# Patient Record
Sex: Male | Born: 1960 | Race: White | Hispanic: No | Marital: Married | Smoking: Never smoker
Health system: Southern US, Community
[De-identification: ages and names within clinical notes are randomized; demographics above are authoritative.]

## PROBLEM LIST (undated history)

## (undated) DIAGNOSIS — G709 Myoneural disorder, unspecified: Secondary | ICD-10-CM

## (undated) DIAGNOSIS — M7122 Synovial cyst of popliteal space [Baker], left knee: Secondary | ICD-10-CM

## (undated) DIAGNOSIS — F419 Anxiety disorder, unspecified: Secondary | ICD-10-CM

## (undated) DIAGNOSIS — Z973 Presence of spectacles and contact lenses: Secondary | ICD-10-CM

## (undated) DIAGNOSIS — G4730 Sleep apnea, unspecified: Secondary | ICD-10-CM

## (undated) DIAGNOSIS — I10 Essential (primary) hypertension: Secondary | ICD-10-CM

## (undated) DIAGNOSIS — M25562 Pain in left knee: Secondary | ICD-10-CM

## (undated) DIAGNOSIS — R29898 Other symptoms and signs involving the musculoskeletal system: Secondary | ICD-10-CM

## (undated) DIAGNOSIS — M48061 Spinal stenosis, lumbar region without neurogenic claudication: Secondary | ICD-10-CM

## (undated) DIAGNOSIS — M1990 Unspecified osteoarthritis, unspecified site: Secondary | ICD-10-CM

## (undated) DIAGNOSIS — Z8669 Personal history of other diseases of the nervous system and sense organs: Secondary | ICD-10-CM

## (undated) DIAGNOSIS — J189 Pneumonia, unspecified organism: Secondary | ICD-10-CM

## (undated) DIAGNOSIS — G8929 Other chronic pain: Secondary | ICD-10-CM

## (undated) HISTORY — PX: EYE SURGERY: SHX253

## (undated) HISTORY — PX: OTHER SURGICAL HISTORY: SHX169

## (undated) HISTORY — PX: POSTERIOR LUMBAR FUSION: SHX6036

## (undated) HISTORY — PX: KNEE ARTHROSCOPY: SUR90

## (undated) HISTORY — DX: Essential (primary) hypertension: I10

---

## 1995-01-27 HISTORY — PX: CERVICAL SPINE SURGERY: SHX589

## 2006-01-26 HISTORY — PX: LUMBAR FUSION: SHX111

## 2014-06-01 DIAGNOSIS — M25519 Pain in unspecified shoulder: Secondary | ICD-10-CM | POA: Insufficient documentation

## 2014-06-01 DIAGNOSIS — M542 Cervicalgia: Secondary | ICD-10-CM | POA: Insufficient documentation

## 2014-07-02 ENCOUNTER — Other Ambulatory Visit: Payer: Self-pay | Admitting: Orthopedic Surgery

## 2014-07-02 DIAGNOSIS — M25512 Pain in left shoulder: Secondary | ICD-10-CM

## 2014-07-14 ENCOUNTER — Inpatient Hospital Stay: Admission: RE | Admit: 2014-07-14 | Payer: Self-pay | Source: Ambulatory Visit

## 2014-08-02 HISTORY — PX: SHOULDER ARTHROSCOPY W/ SUBACROMIAL DECOMPRESSION AND DISTAL CLAVICLE EXCISION: SHX2401

## 2014-08-08 ENCOUNTER — Encounter: Payer: Self-pay | Admitting: Physical Therapy

## 2014-08-08 ENCOUNTER — Ambulatory Visit: Payer: 59 | Attending: Orthopedic Surgery | Admitting: Physical Therapy

## 2014-08-08 DIAGNOSIS — M25612 Stiffness of left shoulder, not elsewhere classified: Secondary | ICD-10-CM | POA: Insufficient documentation

## 2014-08-08 DIAGNOSIS — R29898 Other symptoms and signs involving the musculoskeletal system: Secondary | ICD-10-CM

## 2014-08-08 DIAGNOSIS — M25512 Pain in left shoulder: Secondary | ICD-10-CM | POA: Insufficient documentation

## 2014-08-08 NOTE — Therapy (Signed)
[facility] Outpatient Rehabilitation [facility] [address]  [address] [place], [place], [address] Phone: [phone]   Fax:  [phone]  Physical Therapy Evaluation  Patient Details  Name: Hunter Swanson MRN: [id number] Date of Birth: [birth year] Referring Provider:  Dannielle Huh, MD  Encounter Date: 08/08/2014      PT End of Session - 08/08/14 1145    Visit Number 1   Number of Visits 12   Date for PT Re-Evaluation 09/19/14   PT Start Time 1103   PT Stop Time 1159   PT Time Calculation (min) 56 min      Past Medical History  Diagnosis Date  . Hypertension     Past Surgical History  Procedure Laterality Date  . Knee arthroscopy Left     5 surgeries most recent 2007  . Lumbar fusion  2008    L3-4  . Cervical spine surgery  1997    bone spur removal due to L UE radicular symptoms  . Shoulder arthroscopy w/ subacromial decompression and distal clavicle excision Left 08/02/14    There were no vitals filed for this visit.  Visit Diagnosis:  Left shoulder pain - Plan: PT plan of care cert/re-cert  Shoulder weakness - Plan: PT plan of care cert/re-cert  Shoulder stiffness, left - Plan: PT plan of care cert/re-cert      Subjective Assessment - 08/08/14 1053    Subjective Pt is s/p L Shoulder SAC and DCR performed on 08/02/14 performed by Dr. Sherlean Foot.  Pt states arm is moving better over the past few days.  No difficulty sleeping due to shoulder pain.  States had recently moved and was hanging ceiling fans at time of shoulder pain onset.   Currently in Pain? Yes   Pain Score 1   states L shoulder pain has ranged 1-2/10 over the past few days.   Pain Location Shoulder   Pain Orientation Left   Pain Frequency Intermittent   Aggravating Factors  activity   Pain Relieving Factors rest, meds, ice            [facility] PT Assessment - 08/08/14 0001    Assessment   Medical Diagnosis s/p L Shoulder DCR, SAD   Onset Date/Surgical Date 08/02/14   Next MD  Visit 09/06/14   Balance Screen   Has the patient fallen in the past 6 months No   Has the patient had a decrease in activity level because of a fear of falling?  No   Is the patient reluctant to leave their home because of a fear of falling?  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   Leisure denies regular exercise, has gym at work   Observation/Other Assessments   Focus on Therapeutic Outcomes (FOTO)  55% limitation   ROM / Strength   AROM / PROM / Strength AROM;PROM;Strength   AROM   AROM Assessment Site Shoulder   Right/Left Shoulder Left   Left Shoulder Extension --  WNL   Left Shoulder Flexion 70 Degrees   Left Shoulder ABduction 85 Degrees   Left Shoulder Internal Rotation --  IR Reach T10 bilaterally   Left Shoulder External Rotation --  ER Reach to base of skull (R to T3)   PROM   PROM Assessment Site Shoulder   Right/Left Shoulder Left   Left Shoulder Flexion 145 Degrees   Left Shoulder ABduction 134 Degrees   Left Shoulder Internal Rotation  72 Degrees   Left Shoulder External Rotation 80 Degrees   Strength   Strength Assessment Site Shoulder   Right/Left Shoulder Left   Left Shoulder Flexion 3-/5   Left Shoulder ABduction 3-/5   Left Shoulder Internal Rotation 4/5   Left Shoulder External Rotation 4/5            TODAY'S TREATMENT Manual - L Shoulder distraction along with grade 2 to 3 caudal and AP glides, mobes into Flexion and ABD to tolerance TherEx - HEP instruct and perform: supine wand Flexion AAROM 10x, Standing hand on wall shoulder flexion slide 10x Standing ER Yellow TB 20x  Vasopneumatic compression L shoulder, low pressure, 38 dg, 15'               PT Education - 08/08/14 1144    Education provided Yes   Education Details initial HEP   Person(s) Educated Patient   Methods Explanation;Demonstration;Handout   Comprehension Verbalized  understanding;Returned demonstration          PT Short Term Goals - 08/08/14 1145    PT SHORT TERM GOAL #1   Title pt independent with initial HEP by 08/17/14   Status New   PT SHORT TERM GOAL #2   Title L Shoulder AROM Flexion and ABD to 120 or better by 08/24/14   Status New           PT Long Term Goals - 08/08/14 1146    PT LONG TERM GOAL #1   Title pt independent with advanced HEP as necessary for continued progress by 09/19/14   Status New   PT LONG TERM GOAL #2   Title L shoulder AROM WFL all planes and MMT 4/5 or better all planes by 09/19/14   Status New   PT LONG TERM GOAL #3   Title pt able to return to full participation in all ADLs, chores, and recreational activities without limitation by shoulder pain, LOM, or weakness by 09/19/14   Status New               Plan - 08/08/14 1151    Clinical Impression Statement pt s/p L shoulder DCR and SAD on 08/02/14.  His pain is well controlled and has only been 1-2/10 lately.  His PROM is near normal into ER and IR (80 and 72 respectively), Flexion and ABD more restricted due to pain.  L Shoulder AROM quite restricted in Flexion and ABD with abnormal scapulohumeral rhythm noted (excessive and premature scapular elevation) and both limited to less than 90 degrees.  Pt instructed in HEP to address these impairements and will progress HEP as AROM and function improve.  Will address pt's poor posture (rounded shoulders) and body mechanics as these likely contributed to injury.  Anticipate pt meeting all goals in 4-6 weeks.   Pt will benefit from skilled therapeutic intervention in order to improve on the following deficits Pain;Postural dysfunction;Decreased strength;Decreased mobility;Improper body mechanics;Impaired UE functional use;Decreased range of motion   Rehab Potential Good   PT Frequency 2x / week   PT Duration 6 weeks   PT Treatment/Interventions Therapeutic exercise;Therapeutic activities;Functional mobility  training;Patient/family education;Taping;Vasopneumatic Device;Manual techniques;Neuromuscular re-education;Electrical Stimulation;Cryotherapy   PT Next Visit Plan manual for pain and ROM, exercise for AAROM and AROM to tolerance along with RC strengthening to tolerance   Consulted and Agree with Plan of Care Patient         Problem List There are no active problems to display for this patient.   Kymora Sciara PT,  OCS 08/08/2014, 12:01 PM  [facility] [address]  [address] [place], [place], [address] Phone: [phone]   Fax:  [phone]

## 2014-08-15 ENCOUNTER — Ambulatory Visit: Payer: 59 | Admitting: Rehabilitation

## 2014-08-15 DIAGNOSIS — R29898 Other symptoms and signs involving the musculoskeletal system: Secondary | ICD-10-CM

## 2014-08-15 DIAGNOSIS — M25612 Stiffness of left shoulder, not elsewhere classified: Secondary | ICD-10-CM

## 2014-08-15 DIAGNOSIS — M25512 Pain in left shoulder: Secondary | ICD-10-CM | POA: Diagnosis not present

## 2014-08-15 NOTE — Therapy (Signed)
[facility] Outpatient Rehabilitation [facility] [address]  [address] [place], [place], [address] Phone: [phone]   Fax:  [phone]  Physical Therapy Treatment  Patient Details  Name: Hunter Swanson MRN: [id number] Date of Birth: [birth year] Referring Provider:  Dannielle Huh, MD  Encounter Date: 08/15/2014      PT End of Session - 08/15/14 1104    Visit Number 2   Number of Visits 12   Date for PT Re-Evaluation 09/19/14   PT Start Time 1104   PT Stop Time 1157   PT Time Calculation (min) 53 min      Past Medical History  Diagnosis Date  . Hypertension     Past Surgical History  Procedure Laterality Date  . Knee arthroscopy Left     5 surgeries most recent 2007  . Lumbar fusion  2008    L3-4  . Cervical spine surgery  1997    bone spur removal due to L UE radicular symptoms  . Shoulder arthroscopy w/ subacromial decompression and distal clavicle excision Left 08/02/14    There were no vitals filed for this visit.  Visit Diagnosis:  Left shoulder pain  Shoulder weakness  Shoulder stiffness, left      Subjective Assessment - 08/15/14 1126    Subjective Reports his shoulder hasn't been hurting at all. Notices some discomfort with abduction movements.    Currently in Pain? No/denies            [facility] PT Assessment - 08/15/14 1107    AROM   AROM Assessment Site Shoulder   Right/Left Shoulder Left   Left Shoulder Flexion 150 Degrees   Left Shoulder ABduction 135 Degrees      TODAY'S TREATMENT TherEx - Seated Pulleys Flexion x2'  Manual - L Shoulder distraction along with grade 2 to 3 caudal and AP glides, PROM into Flexion and Abduction  Supine IR/ER iso at 45 degrees Abduction 5"x10 Supine Rhythmic Stab at 90 degrees Flexion 2x30" (all directions) Supine Pullover 4# x10 Supine Circles at 90 degrees Flexion 4# x15 CW/CCW Supine Serratus Punches 4# x15 Rt Side-Lying Lt ER 0# x10, 3# x10 Rt Side-Lying Lt Abduction 0#  x10 Pball (65cm) Roll up wall with lift off 3"x10 Standing Rows Blue TB x10 Standing Bil Extension Blue TB x10  Standing IR/ER with Red TB x15 each    Vasopneumatic compression L shoulder, low pressure, 38 dg, 15'   Increased HEP      PT Short Term Goals - 08/15/14 1122    PT SHORT TERM GOAL #1   Title pt independent with initial HEP by 08/17/14   Status Achieved   PT SHORT TERM GOAL #2   Title L Shoulder AROM Flexion and ABD to 120 or better by 08/24/14   Status Achieved           PT Long Term Goals - 08/15/14 1122    PT LONG TERM GOAL #1   Title pt independent with advanced HEP as necessary for continued progress by 09/19/14   Status On-going   PT LONG TERM GOAL #2   Title L shoulder AROM WFL all planes and MMT 4/5 or better all planes by 09/19/14   Status On-going   PT LONG TERM GOAL #3   Title pt able to return to full participation in all ADLs, chores, and recreational activities without limitation by shoulder pain, LOM, or weakness by 09/19/14   Status On-going  Plan - 08/15/14 1143    Clinical Impression Statement Excellent tolerance to exercises and improvements in ROM. Still noted shoulder hike with shoulder abduction and flexion, most prominent with abduction. Noted slight pain with PROM IR which pt reported was in the joint.      PT Next Visit Plan manual for pain and ROM, exercise for AAROM and AROM to tolerance along with RC strengthening to tolerance   Consulted and Agree with Plan of Care Patient        Problem List There are no active problems to display for this patient.   [address] Roberts Roadhelby J Tommy Goostree, PTA 08/15/2014, 11:51 AM  [facility] [place] [facility] [address]  [address] [place], [place], [address] Phone: [phone]   Fax:  [phone]

## 2014-08-16 ENCOUNTER — Ambulatory Visit: Payer: 59 | Admitting: Rehabilitation

## 2014-08-16 DIAGNOSIS — M25512 Pain in left shoulder: Secondary | ICD-10-CM

## 2014-08-16 DIAGNOSIS — R29898 Other symptoms and signs involving the musculoskeletal system: Secondary | ICD-10-CM

## 2014-08-16 DIAGNOSIS — M25612 Stiffness of left shoulder, not elsewhere classified: Secondary | ICD-10-CM

## 2014-08-16 NOTE — Therapy (Signed)
[facility] Outpatient Rehabilitation [facility] [address]  [address] [place], [place], [address] Phone: [phone]   Fax:  [phone]  Physical Therapy Treatment  Patient Details  Name: Hunter Swanson MRN: [id number] Date of Birth: November 05, 1960 Referring Provider:  Dannielle Huh, MD  Encounter Date: 08/16/2014      PT End of Session - 08/16/14 1316    Visit Number 3   Number of Visits 12   Date for PT Re-Evaluation 09/19/14   PT Start Time 1315   PT Stop Time 1355   PT Time Calculation (min) 40 min      Past Medical History  Diagnosis Date  . Hypertension     Past Surgical History  Procedure Laterality Date  . Knee arthroscopy Left     5 surgeries most recent 2007  . Lumbar fusion  2008    L3-4  . Cervical spine surgery  1997    bone spur removal due to L UE radicular symptoms  . Shoulder arthroscopy w/ subacromial decompression and distal clavicle excision Left 08/02/14    There were no vitals filed for this visit.  Visit Diagnosis:  Left shoulder pain  Shoulder weakness  Shoulder stiffness, left      Subjective Assessment - 08/16/14 1315    Subjective States shoulder is sore today (was just in here yesterday) but doing ok. Noted a sharp pain yesterday evening (2/10) that lasted only a few seconds.    Currently in Pain? No/denies      TODAY'S TREATMENT TherEx - UBE level 2.0 2'/2'  Manual - L Shoulder distraction along with grade 2 to 3 caudal and AP glides PROM into Flexion, Abduction, IR, ER.  STM to Lt Pec, lateral shoulder Rt Side-Lying Lt scap mobs grade 2.   TherEx - Rt Side-Lying Lt Abduction 0# x10 Rt Side-Lying Lt Horizontal Abduction 0# x10 Rt Side-Lying Lt Flexion 0# x10 Pball roll up wall (65cm) Roll up for Flexion Stretch 3"x10 Small Green Ball on wall circles at 90 degrees Flexion x15 cw/ccw then at 90 degrees abduction Corner Pec Stretch 3x20" Body Blade Bil Flexion 2x30", at side ER/IR 2x30", at side abd/add  2x30"        PT Short Term Goals - 08/15/14 1122    PT SHORT TERM GOAL #1   Title pt independent with initial HEP by 08/17/14   Status Achieved   PT SHORT TERM GOAL #2   Title L Shoulder AROM Flexion and ABD to 120 or better by 08/24/14   Status Achieved           PT Long Term Goals - 08/15/14 1122    PT LONG TERM GOAL #1   Title pt independent with advanced HEP as necessary for continued progress by 09/19/14   Status On-going   PT LONG TERM GOAL #2   Title L shoulder AROM WFL all planes and MMT 4/5 or better all planes by 09/19/14   Status On-going   PT LONG TERM GOAL #3   Title pt able to return to full participation in all ADLs, chores, and recreational activities without limitation by shoulder pain, LOM, or weakness by 09/19/14   Status On-going               Plan - 08/16/14 1318    Clinical Impression Statement Performed less intensity exercises today due to increased soreness from yesterday. No pain noted with exercises, just stretching per pt. Will try 1x week next week due to pt's  excellent status.    PT Next Visit Plan manual for pain and ROM, exercise for AAROM and AROM to tolerance along with RC strengthening to tolerance   Consulted and Agree with Plan of Care Patient        Problem List There are no active problems to display for this patient.   [name] [place], [place] 08/16/2014, 3:36 PM  [facility] [address]  [address] [place], [place], [address] Phone: [phone]   Fax:  [phone]

## 2014-08-21 ENCOUNTER — Ambulatory Visit: Payer: 59 | Admitting: Physical Therapy

## 2014-08-24 ENCOUNTER — Ambulatory Visit: Payer: 59 | Admitting: Rehabilitation

## 2014-08-24 DIAGNOSIS — R29898 Other symptoms and signs involving the musculoskeletal system: Secondary | ICD-10-CM

## 2014-08-24 DIAGNOSIS — M25512 Pain in left shoulder: Secondary | ICD-10-CM | POA: Diagnosis not present

## 2014-08-24 DIAGNOSIS — M25612 Stiffness of left shoulder, not elsewhere classified: Secondary | ICD-10-CM

## 2014-08-24 NOTE — Therapy (Addendum)
[facility] [place] [address]  [place], [place], [address] Phone: [phone]   Fax:  [phone]  Physical Therapy Treatment  Patient Details  Name: Hunter Swanson MRN: [id number] Date of Birth: 1960-02-06 Referring Provider:  Vickey Huger, MD  Encounter Date: 08/24/2014      PT End of Session - 08/24/14 1111    Visit Number 4   Number of Visits 12   Date for PT Re-Evaluation 09/19/14   PT Start Time 1105   PT Stop Time 1150   PT Time Calculation (min) 45 min      Past Medical History  Diagnosis Date  . Hypertension     Past Surgical History  Procedure Laterality Date  . Knee arthroscopy Left     5 surgeries Swanson recent 2007  . Lumbar fusion  2008    L3-4  . Cervical spine surgery  1997    bone spur removal due to L UE radicular symptoms  . Shoulder arthroscopy w/ subacromial decompression and distal clavicle excision Left 08/02/14    There were no vitals filed for this visit.  Visit Diagnosis:  Left shoulder pain  Shoulder weakness  Shoulder stiffness, left      Subjective Assessment - 08/24/14 1106    Subjective Reports shoulder is going great. Went out and hit some golf balls last Sunday without pain. Has not had pain over the past week, just some soreness from working it out.    Currently in Pain? No/denies            [facility] PT Assessment - 08/24/14 1107    ROM / Strength   AROM / PROM / Strength AROM;PROM;Strength   AROM   AROM Assessment Site Shoulder   Right/Left Shoulder Left   Left Shoulder Flexion 154 Degrees   Left Shoulder ABduction 154 Degrees   Left Shoulder Internal Rotation 48 Degrees  FIR reach to T10 bilaterally   Left Shoulder External Rotation 70 Degrees  FER Reach to T3 bilaterally   PROM   PROM Assessment Site Shoulder   Right/Left Shoulder Left   Left Shoulder Flexion 160 Degrees   Left Shoulder ABduction 158 Degrees   Left Shoulder Internal Rotation 80 Degrees    Left Shoulder External Rotation 90 Degrees   Strength   Strength Assessment Site Shoulder   Right/Left Shoulder Left   Left Shoulder Flexion 4/5  slight pain all around shoulder   Left Shoulder ABduction 4/5  slight pain all around shoulder   Left Shoulder Internal Rotation 4+/5   Left Shoulder External Rotation 4+/5      TODAY'S TREATMENT TherEx - ROM/MMT Rt Side-Lying Lt Abduction 2# x10 Rt Side-Lying Lt ER 2# x10 Rt Side-Lying Lt Flexion 2# x10 Standing Pball (55cm) roll up wall x10, then with lift off 3"x10 Standing with 1/2 Foam Roll along spine: Bilateral Flexion 2# x10, scaption 2# x10, Bilateral Shoulder ER Green TB x15, Bilateral Shoulder Horizontal Abduction Green TB x15 Prone over pbal (65cm) Y 2# x10, Bent T 2# x10 Standing Lt Flexion with Green TB x10 Standing Lt Abduction with Green TB x10  Corner Pec Stretch 3x20" Low Row 35# 3x10  Increased HEP      PT Short Term Goals - 08/15/14 1122    PT SHORT TERM GOAL #1   Title pt independent with initial HEP by 08/17/14   Status Achieved   PT SHORT TERM GOAL #2   Title L Shoulder AROM Flexion and ABD to  120 or better by 08/24/14   Status Achieved           PT Long Term Goals - 08/15/14 1122    PT LONG TERM GOAL #1   Title pt independent with advanced HEP as necessary for continued progress by 09/19/14   Status Achieved   PT LONG TERM GOAL #2   Title L shoulder AROM WFL all planes and MMT 4/5 or better all planes by 09/19/14   Status Achieved but with slight pain   PT LONG TERM GOAL #3   Title pt able to return to full participation in all ADLs, chores, and recreational activities without limitation by shoulder pain, LOM, or weakness by 09/19/14   Status On-going               Plan - 08/24/14 1154    Clinical Impression Statement Pt continues to demonstrate excellent progress with ROM and MMTing. He was able to go hit some golf balls without pain. He would like to continue just 1 time a week and  perfom his HEP. AROM is near normal compared to the non surgical side. Flexion and abduction MMTing are still at 4/5 while IR and ER are 4+/5.    PT Next Visit Plan Continue with strengthening and return to golfing exercises.    Consulted and Agree with Plan of Care Patient        Problem List There are no active problems to display for this patient.   [place], PTA 08/24/2014, 11:57 AM  [facility] [address]  [place], [place], [address] Phone: [phone]   Fax:  [phone]     PHYSICAL THERAPY DISCHARGE SUMMARY  Visits from Start of Care: 4  Current functional level related to goals / functional outcomes Goals likely met   Remaining deficits: Intermittent slight pain, mild shoulder weakness   Education / Equipment: HEP  Plan: Patient agrees to discharge.  Patient goals were partially met. Patient is being discharged due to not returning since the last visit.  ?????       Hunter Swanson last seen on 08/24/14 at which time he was progressing very well and his PT frequency was decreased to 1x/wk; however, Hunter Swanson never returned for additional appointments.  He's likely doing fine given excellent progress at last appointment.  Since we haven't seen Hunter Swanson for over 30 days we are discharging him from our care at this time.  Hunter Swanson PT, OCS 09/28/2014  10:30 AM

## 2014-08-28 ENCOUNTER — Ambulatory Visit: Payer: 59 | Admitting: Physical Therapy

## 2014-08-30 ENCOUNTER — Ambulatory Visit: Payer: 59 | Attending: Orthopedic Surgery | Admitting: Rehabilitation

## 2014-09-04 ENCOUNTER — Ambulatory Visit: Payer: 59 | Admitting: Physical Therapy

## 2014-09-07 ENCOUNTER — Ambulatory Visit: Payer: 59 | Admitting: Physical Therapy

## 2014-09-10 ENCOUNTER — Encounter: Payer: 59 | Admitting: Rehabilitation

## 2014-09-11 ENCOUNTER — Ambulatory Visit: Payer: 59 | Admitting: Rehabilitation

## 2014-09-14 ENCOUNTER — Ambulatory Visit: Payer: 59 | Admitting: Physical Therapy

## 2014-09-18 ENCOUNTER — Ambulatory Visit: Payer: 59 | Admitting: Rehabilitation

## 2014-09-21 ENCOUNTER — Ambulatory Visit: Payer: 59 | Admitting: Physical Therapy

## 2016-03-18 HISTORY — PX: VITRECTOMY: SHX106

## 2016-11-10 DIAGNOSIS — G8929 Other chronic pain: Secondary | ICD-10-CM | POA: Insufficient documentation

## 2016-12-24 ENCOUNTER — Other Ambulatory Visit: Payer: Self-pay | Admitting: Orthopedic Surgery

## 2016-12-24 DIAGNOSIS — M25562 Pain in left knee: Principal | ICD-10-CM

## 2016-12-24 DIAGNOSIS — G8929 Other chronic pain: Secondary | ICD-10-CM

## 2016-12-30 ENCOUNTER — Ambulatory Visit
Admission: RE | Admit: 2016-12-30 | Discharge: 2016-12-30 | Disposition: A | Discharge status: Home / Self Care | Payer: 59 | Source: Ambulatory Visit | Attending: Orthopedic Surgery | Admitting: Orthopedic Surgery

## 2016-12-30 DIAGNOSIS — G8929 Other chronic pain: Secondary | ICD-10-CM

## 2016-12-30 DIAGNOSIS — M25562 Pain in left knee: Principal | ICD-10-CM

## 2016-12-31 DIAGNOSIS — S83282A Other tear of lateral meniscus, current injury, left knee, initial encounter: Secondary | ICD-10-CM | POA: Insufficient documentation

## 2017-01-21 DIAGNOSIS — Z9889 Other specified postprocedural states: Secondary | ICD-10-CM | POA: Insufficient documentation

## 2017-02-22 ENCOUNTER — Ambulatory Visit
Admission: RE | Admit: 2017-02-22 | Discharge: 2017-02-22 | Disposition: A | Discharge status: Home / Self Care | Payer: 59 | Source: Ambulatory Visit | Attending: Orthopedic Surgery | Admitting: Orthopedic Surgery

## 2017-02-22 ENCOUNTER — Other Ambulatory Visit: Payer: Self-pay | Admitting: Orthopedic Surgery

## 2017-02-22 DIAGNOSIS — M25562 Pain in left knee: Secondary | ICD-10-CM

## 2017-02-24 DIAGNOSIS — M5416 Radiculopathy, lumbar region: Secondary | ICD-10-CM | POA: Insufficient documentation

## 2017-03-03 DIAGNOSIS — M48061 Spinal stenosis, lumbar region without neurogenic claudication: Secondary | ICD-10-CM | POA: Insufficient documentation

## 2017-03-04 ENCOUNTER — Other Ambulatory Visit: Payer: Self-pay | Admitting: Orthopedic Surgery

## 2017-03-04 DIAGNOSIS — M48061 Spinal stenosis, lumbar region without neurogenic claudication: Secondary | ICD-10-CM

## 2017-03-09 ENCOUNTER — Ambulatory Visit
Admission: RE | Admit: 2017-03-09 | Discharge: 2017-03-09 | Disposition: A | Discharge status: Home / Self Care | Payer: 59 | Source: Ambulatory Visit | Attending: Orthopedic Surgery | Admitting: Orthopedic Surgery

## 2017-03-09 DIAGNOSIS — M48061 Spinal stenosis, lumbar region without neurogenic claudication: Secondary | ICD-10-CM

## 2017-03-09 MED ORDER — GADOBENATE DIMEGLUMINE 529 MG/ML IV SOLN
20.0000 mL | Freq: Once | INTRAVENOUS | Status: AC | PRN
  Administered 2017-03-09: 20 mL via INTRAVENOUS

## 2017-06-30 DIAGNOSIS — Z981 Arthrodesis status: Secondary | ICD-10-CM | POA: Insufficient documentation

## 2017-06-30 DIAGNOSIS — M1612 Unilateral primary osteoarthritis, left hip: Secondary | ICD-10-CM | POA: Insufficient documentation

## 2017-07-02 DIAGNOSIS — M1712 Unilateral primary osteoarthritis, left knee: Secondary | ICD-10-CM | POA: Insufficient documentation

## 2017-07-13 NOTE — H&P (Signed)
TOTAL HIP ADMISSION H&P  Patient is admitted for left total hip arthroplasty.  Subjective:  Chief Complaint: left hip pain  HPI: Hunter Swanson, 57 y.o. male, has a history of pain and functional disability in the left hip(s) due to arthritis and patient has failed non-surgical conservative treatments for greater than 12 weeks to include NSAID's and/or analgesics, corticosteriod injections and activity modification.  Onset of symptoms was abrupt starting less than a year ago with rapidly worsening course since that time.The patient noted no past surgery on the left hip(s).  Patient currently rates pain in the left hip at 9 out of 10 with activity. Patient has night pain, worsening of pain with activity and weight bearing and pain that interfers with activities of daily living. Patient has evidence of joint space narrowing with near complete erosion of the femoral head by imaging studies. This condition presents safety issues increasing the risk of falls. There is no current active infection.  There are no active problems to display for this patient.  Past Medical History:  Diagnosis Date  . Hypertension     Past Surgical History:  Procedure Laterality Date  . CERVICAL SPINE SURGERY  1997   bone spur removal due to L UE radicular symptoms  . KNEE ARTHROSCOPY Left    5 surgeries most recent 2007  . LUMBAR FUSION  2008   L3-4  . SHOULDER ARTHROSCOPY W/ SUBACROMIAL DECOMPRESSION AND DISTAL CLAVICLE EXCISION Left 08/02/14    No current facility-administered medications for this encounter.    Current Outpatient Medications  Medication Sig Dispense Refill Last Dose  . aspirin 81 MG tablet Take 81 mg by mouth daily.   Taking  . diclofenac (VOLTAREN) 75 MG EC tablet Take 75 mg by mouth 2 (two) times daily.   Taking  . losartan (COZAAR) 100 MG tablet Take 100 mg by mouth daily.   Taking  . venlafaxine (EFFEXOR) 75 MG tablet Take 75 mg by mouth 2 (two) times daily.      Allergies  Allergen  Reactions  . Neurontin [Gabapentin] Rash  . Penicillins Rash    Social History   Tobacco Use  . Smoking status: Never Smoker  Substance Use Topics  . Alcohol use: Not on file    No family history on file.   Review of Systems  Constitutional: Negative for chills and fever.  HENT: Negative for congestion, sore throat and tinnitus.   Eyes: Negative for double vision, photophobia and pain.  Respiratory: Negative for cough, shortness of breath and wheezing.   Cardiovascular: Negative for chest pain, palpitations and orthopnea.  Gastrointestinal: Negative for heartburn, nausea and vomiting.  Genitourinary: Negative for dysuria, frequency and urgency.  Musculoskeletal: Positive for joint pain.  Neurological: Negative for dizziness, weakness and headaches.  Psychiatric/Behavioral: Negative for depression.    Objective: Well nourished and well developed. General: Alert and oriented x3, cooperative and pleasant, no acute distress. Head: normocephalic, atraumatic, neck supple. Eyes: EOMI. Respiratory: breath sounds clear in all fields, no wheezing, rales, or rhonchi. Cardiovascular: Regular rate and rhythm, no murmurs, gallops or rubs.  Abdomen: non-tender to palpation and soft, normoactive bowel sounds. Musculoskeletal: Left Hip Exam: ROM: Flexion to 90, Internal Rotation 0, External Rotation 0, and Abduction 20 degrees.  There is no tenderness over the greater trochanter.  Left Knee Exam:  No effusion.  Range of motion is 0-125 degrees.  Moderate crepitus on range of motion of the knee.  No medial or lateral joint line tenderness.  Stable knee. Calves  soft and nontender. Motor function intact in LE. Strength 5/5 LE bilaterally. Neuro: Distal pulses 2+. Sensation to light touch intact in LE  Vital signs in last 24 hours: BP: 138/92 mmHg Pulse: 88 bpm  Labs:  There is no height or weight on file to calculate BMI.  Imaging Review Plain radiographs demonstrate severe  degenerative joint disease of the left hip(s). The bone quality appears to be adequate for age and reported activity level.  Preoperative templating of the joint replacement has been completed, documented, and submitted to the Operating Room personnel in order to optimize intra-operative equipment management.  Assessment/Plan:  End stage arthritis, left hip(s)  The patient history, physical examination, clinical judgement of the provider and imaging studies are consistent with end stage degenerative joint disease of the left hip(s) and total hip arthroplasty is deemed medically necessary. The treatment options including medical management, injection therapy, arthroscopy and arthroplasty were discussed at length. The risks and benefits of total hip arthroplasty were presented and reviewed. The risks due to aseptic loosening, infection, stiffness, dislocation/subluxation,  thromboembolic complications and other imponderables were discussed.  The patient acknowledged the explanation, agreed to proceed with the plan and consent was signed. Patient is being admitted for inpatient treatment for surgery, pain control, PT, OT, prophylactic antibiotics, VTE prophylaxis, progressive ambulation and ADL's and discharge planning.The patient is planning to be discharged home with outpatient physical therapy at [facility].   Therapy Plans: outpatient therapy at [facility] Disposition: Home with family Planned DVT Prophylaxis: aspirin 325mg  BID DME needed: none PCP: Dr. Earlene Plater at [facility] in [place] TXA: IV Allergies: Penicillin (rash)  - Patient was instructed on what medications to stop prior to surgery. - Follow-up visit in 2 weeks with Dr. Lequita Halt - Begin physical therapy following surgery - Pre-operative lab work as pre-surgical testing - Prescriptions will be provided in hospital at time of discharge  Arther Abbott, PA-C Orthopedic Surgery [facility] [place]

## 2017-07-23 NOTE — Progress Notes (Signed)
07-15-17 Surgical Clearance on chart from Dr. Julius BowelsPollock

## 2017-07-23 NOTE — Patient Instructions (Addendum)
Your procedure is scheduled on:  Monday, August 02, 2017   [facility]    Report to admitting at  11:45 AM   Call this number if you have problems the morning of surgery [phone]   Do not eat food:After Midnight. From midnight until 8:00 am day of surgery    May have Clear Liquid diet.      CLEAR LIQUID DIET   Foods Allowed                                                                     Foods Excluded  Water, Coffee and tea, regular and decaf                             liquids that you cannot  Plain Jell-O in any flavor                                             see through such as: Fruit ices (not with fruit pulp)                                     milk, soups, orange juice  Iced Popsicles                                    All solid food Carbonated beverages, regular and diet                                    Cranberry, grape and apple juices Sports drinks like Gatorade Lightly seasoned clear broth or consume(fat free) Sugar, honey syrup  Sample Menu Breakfast                                Lunch                                     Supper Cranberry juice                    Beef broth                            Chicken broth Jell-O                                     Grape juice                           Apple juice Coffee or tea  Jell-O                                      Popsicle                                                Coffee or tea                        Coffee or tea   Take these medicines the morning of surgery with A SIP OF WATER:      None, May use Flonase if needed                               You may not have any metal on your body including jewelry, and body piercings             Do not wear lotions, powders, perfumes/cologne, or deodorant                          Men may shave face and neck.   Do not bring valuables to the hospital. [facility] IS NOT              RESPONSIBLE   FOR VALUABLES.   Contacts, dentures or bridgework may not be worn into surgery.   Leave suitcase in the car. After surgery it may be brought to your room.   Special Instructions:     Deep Breathing/ Cough and Leg Exercises              Please read over the following fact sheets you were given:  [facility] - Preparing for Surgery Before surgery, you can play an important role.  Because skin is not sterile, your skin needs to be as free of germs as possible.  You can reduce the number of germs on your skin by washing with CHG (chlorahexidine gluconate) soap before surgery.  CHG is an antiseptic cleaner which kills germs and bonds with the skin to continue killing germs even after washing. Please DO NOT use if you have an allergy to CHG or antibacterial soaps.  If your skin becomes reddened/irritated stop using the CHG and inform your nurse when you arrive at Short Stay. Do not shave (including legs and underarms) for at least 48 hours prior to the first CHG shower.  You may shave your face/neck.  Please follow these instructions carefully:  1.  Shower with CHG Soap the night before surgery and the  morning of surgery.  2.  If you choose to wash your hair, wash your hair first as usual with your normal  shampoo.  3.  After you shampoo, rinse your hair and body thoroughly to remove the shampoo.                             4.  Use CHG as you would any other liquid soap.  You can apply chg directly to the skin and wash.                    Gently with a scrungie or clean washcloth.  5.  Apply the CHG Soap to your body ONLY FROM THE NECK DOWN.                       Do not use on face/ open Wound or open sores.                      Avoid contact with eyes, ears mouth and genitals (private parts).                       Wash face,  Genitals (private parts) with your normal soap.             6.  Wash thoroughly, paying special attention to the area where your surgery  will be  performed.  7.  Thoroughly rinse your body with warm water from the neck down.  8.  DO NOT shower/wash with your normal soap after using and rinsing off the CHG Soap.             9.  Pat yourself dry with a clean towel.            10.  Wear clean pajamas.            11.  Place clean sheets on your bed the night of your first shower and do not  sleep with pets. Day of Surgery : Do not apply any lotions/deodorants the morning of surgery.  Please wear clean clothes to the hospital/surgery center.  FAILURE TO FOLLOW THESE INSTRUCTIONS MAY RESULT IN THE CANCELLATION OF YOUR SURGERY  PATIENT SIGNATURE_________________________________  NURSE SIGNATURE__________________________________  ________________________________________________________________________   Hunter Swanson  An incentive spirometer is a tool that can help keep your lungs clear and active. This tool measures how well you are filling your lungs with each breath. Taking long deep breaths may help reverse or decrease the chance of developing breathing (pulmonary) problems (especially infection) following:  A long period of time when you are unable to move or be active. BEFORE THE PROCEDURE   If the spirometer includes an indicator to show your best effort, your nurse or respiratory therapist will set it to a desired goal.  If possible, sit up straight or lean slightly forward. Try not to slouch.  Hold the incentive spirometer in an upright position. INSTRUCTIONS FOR USE  1. Sit on the edge of your bed if possible, or sit up as far as you can in bed or on a chair. 2. Hold the incentive spirometer in an upright position. 3. Breathe out normally. 4. Place the mouthpiece in your mouth and seal your lips tightly around it. 5. Breathe in slowly and as deeply as possible, raising the piston or the ball toward the top of the column. 6. Hold your breath for 3-5 seconds or for as long as possible. Allow the piston or ball to  fall to the bottom of the column. 7. Remove the mouthpiece from your mouth and breathe out normally. 8. Rest for a few seconds and repeat Steps 1 through 7 at least 10 times every 1-2 hours when you are awake. Take your time and take a few normal breaths between deep breaths. 9. The spirometer may include an indicator to show your best effort. Use the indicator as a goal to work toward during each repetition. 10. After each set of 10 deep breaths, practice coughing to be sure your lungs are clear. If you have an incision (the cut made at the  time of surgery), support your incision when coughing by placing a pillow or rolled up towels firmly against it. Once you are able to get out of bed, walk around indoors and cough well. You may stop using the incentive spirometer when instructed by your caregiver.  RISKS AND COMPLICATIONS  Take your time so you do not get dizzy or light-headed.  If you are in pain, you may need to take or ask for pain medication before doing incentive spirometry. It is harder to take a deep breath if you are having pain. AFTER USE  Rest and breathe slowly and easily.  It can be helpful to keep track of a log of your progress. Your caregiver can provide you with a simple table to help with this. If you are using the spirometer at home, follow these instructions: SEEK MEDICAL CARE IF:   You are having difficultly using the spirometer.  You have trouble using the spirometer as often as instructed.  Your pain medication is not giving enough relief while using the spirometer.  You develop fever of 100.5 F (38.1 C) or higher. SEEK IMMEDIATE MEDICAL CARE IF:   You cough up bloody sputum that had not been present before.  You develop fever of 102 F (38.9 C) or greater.  You develop worsening pain at or near the incision site. MAKE SURE YOU:   Understand these instructions.  Will watch your condition.  Will get help right away if you are not doing well or get  worse. Document Released: 05/25/2006 Document Revised: 04/06/2011 Document Reviewed: 07/26/2006 Exit[employer] Patient Information 2014 ExitCare, [place].   ________________________________________________________________________  WHAT IS A BLOOD TRANSFUSION? Blood Transfusion Information  A transfusion is the replacement of blood or some of its parts. Blood is made up of multiple cells which provide different functions.  Red blood cells carry oxygen and are used for blood loss replacement.  White blood cells fight against infection.  Platelets control bleeding.  Plasma helps clot blood.  Other blood products are available for specialized needs, such as hemophilia or other clotting disorders. BEFORE THE TRANSFUSION  Who gives blood for transfusions?   Healthy volunteers who are fully evaluated to make sure their blood is safe. This is blood bank blood. Transfusion therapy is the safest it has ever been in the practice of medicine. Before blood is taken from a donor, a complete history is taken to make sure that person has no history of diseases nor engages in risky social behavior (examples are intravenous drug use or sexual activity with multiple partners). The donor's travel history is screened to minimize risk of transmitting infections, such as malaria. The donated blood is tested for signs of infectious diseases, such as HIV and hepatitis. The blood is then tested to be sure it is compatible with you in order to minimize the chance of a transfusion reaction. If you or a relative donates blood, this is often done in anticipation of surgery and is not appropriate for emergency situations. It takes many days to process the donated blood. RISKS AND COMPLICATIONS Although transfusion therapy is very safe and saves many lives, the main dangers of transfusion include:   Getting an infectious disease.  Developing a transfusion reaction. This is an allergic reaction to something in the blood you  were given. Every precaution is taken to prevent this. The decision to have a blood transfusion has been considered carefully by your caregiver before blood is given. Blood is not given unless the benefits outweigh the risks.  AFTER THE TRANSFUSION  Right after receiving a blood transfusion, you will usually feel much better and more energetic. This is especially true if your red blood cells have gotten low (anemic). The transfusion raises the level of the red blood cells which carry oxygen, and this usually causes an energy increase.  The nurse administering the transfusion will monitor you carefully for complications. HOME CARE INSTRUCTIONS  No special instructions are needed after a transfusion. You may find your energy is better. Speak with your caregiver about any limitations on activity for underlying diseases you may have. SEEK MEDICAL CARE IF:   Your condition is not improving after your transfusion.  You develop redness or irritation at the intravenous (IV) site. SEEK IMMEDIATE MEDICAL CARE IF:  Any of the following symptoms occur over the next 12 hours:  Shaking chills.  You have a temperature by mouth above 102 F (38.9 C), not controlled by medicine.  Chest, back, or muscle pain.  People around you feel you are not acting correctly or are confused.  Shortness of breath or difficulty breathing.  Dizziness and fainting.  You get a rash or develop hives.  You have a decrease in urine output.  Your urine turns a dark color or changes to pink, red, or brown. Any of the following symptoms occur over the next 10 days:  You have a temperature by mouth above 102 F (38.9 C), not controlled by medicine.  Shortness of breath.  Weakness after normal activity.  The white part of the eye turns yellow (jaundice).  You have a decrease in the amount of urine or are urinating less often.  Your urine turns a dark color or changes to pink, red, or brown. Document Released:  01/10/2000 Document Revised: 04/06/2011 Document Reviewed: 08/29/2007 [facility] Patient Information 2014 [place], [place].  _______________________________________________________________________

## 2017-07-26 ENCOUNTER — Encounter (HOSPITAL_COMMUNITY): Payer: Self-pay

## 2017-07-27 ENCOUNTER — Encounter (HOSPITAL_COMMUNITY): Payer: Self-pay

## 2017-07-27 ENCOUNTER — Other Ambulatory Visit: Payer: Self-pay

## 2017-07-27 ENCOUNTER — Encounter (HOSPITAL_COMMUNITY)
Admission: RE | Admit: 2017-07-27 | Discharge: 2017-07-27 | Disposition: A | Discharge status: Home / Self Care | Payer: 59 | Source: Ambulatory Visit | Attending: Orthopedic Surgery | Admitting: Orthopedic Surgery

## 2017-07-27 DIAGNOSIS — Z01812 Encounter for preprocedural laboratory examination: Secondary | ICD-10-CM | POA: Insufficient documentation

## 2017-07-27 DIAGNOSIS — Z01810 Encounter for preprocedural cardiovascular examination: Secondary | ICD-10-CM | POA: Diagnosis present

## 2017-07-27 HISTORY — DX: Pain in left knee: M25.562

## 2017-07-27 HISTORY — DX: Personal history of other diseases of the nervous system and sense organs: Z86.69

## 2017-07-27 HISTORY — DX: Spinal stenosis, lumbar region without neurogenic claudication: M48.061

## 2017-07-27 HISTORY — DX: Presence of spectacles and contact lenses: Z97.3

## 2017-07-27 HISTORY — DX: Anxiety disorder, unspecified: F41.9

## 2017-07-27 HISTORY — DX: Other symptoms and signs involving the musculoskeletal system: R29.898

## 2017-07-27 HISTORY — DX: Unspecified osteoarthritis, unspecified site: M19.90

## 2017-07-27 HISTORY — DX: Synovial cyst of popliteal space (Baker), left knee: M71.22

## 2017-07-27 HISTORY — DX: Other chronic pain: G89.29

## 2017-07-27 LAB — COMPREHENSIVE METABOLIC PANEL
ALBUMIN: 4.1 g/dL (ref 3.5–5.0)
ALT: 59 U/L — ABNORMAL HIGH (ref 0–44)
AST: 45 U/L — AB (ref 15–41)
Alkaline Phosphatase: 103 U/L (ref 38–126)
Anion gap: 9 (ref 5–15)
BILIRUBIN TOTAL: 0.6 mg/dL (ref 0.3–1.2)
BUN: 17 mg/dL (ref 6–20)
CALCIUM: 9.6 mg/dL (ref 8.9–10.3)
CO2: 24 mmol/L (ref 22–32)
Chloride: 104 mmol/L (ref 98–111)
Creatinine, Ser: 1.03 mg/dL (ref 0.61–1.24)
GFR calc Af Amer: >60 mL/min (ref >60)
GLUCOSE: 100 mg/dL — AB (ref 70–99)
POTASSIUM: 4 mmol/L (ref 3.5–5.1)
Sodium: 137 mmol/L (ref 135–145)
TOTAL PROTEIN: 7.5 g/dL (ref 6.5–8.1)

## 2017-07-27 LAB — URINALYSIS, ROUTINE W REFLEX MICROSCOPIC
BILIRUBIN URINE: NEGATIVE
Glucose, UA: NEGATIVE mg/dL
Hgb urine dipstick: NEGATIVE
Ketones, ur: NEGATIVE mg/dL
Leukocytes, UA: NEGATIVE
NITRITE: NEGATIVE
PH: 6 (ref 5.0–8.0)
Protein, ur: NEGATIVE mg/dL
SPECIFIC GRAVITY, URINE: 1.018 (ref 1.005–1.030)

## 2017-07-27 LAB — CBC
HEMATOCRIT: 43.8 % (ref 39.0–52.0)
HEMOGLOBIN: 14.9 g/dL (ref 13.0–17.0)
MCH: 29.9 pg (ref 26.0–34.0)
MCHC: 34 g/dL (ref 30.0–36.0)
MCV: 88 fL (ref 78.0–100.0)
Platelets: 368 10*3/uL (ref 150–400)
RBC: 4.98 MIL/uL (ref 4.22–5.81)
RDW: 15.4 % (ref 11.5–15.5)
WBC: 6.1 10*3/uL (ref 4.0–10.5)

## 2017-07-27 LAB — PROTIME-INR
INR: 1.04
PROTHROMBIN TIME: 13.5 s (ref 11.4–15.2)

## 2017-07-27 LAB — SURGICAL PCR SCREEN
MRSA, PCR: NEGATIVE
STAPHYLOCOCCUS AUREUS: NEGATIVE

## 2017-07-27 LAB — ABO/RH: ABO/RH(D): O POS

## 2017-07-27 LAB — APTT: aPTT: 30 seconds (ref 24–36)

## 2017-07-27 NOTE — Progress Notes (Addendum)
Clearance note dated 07-09-2017 from dr Lequita Haltaluisio office via fax from dr Timothy Lassorusso in chart.   ADDENDUM:  CMET result dated 07-27-2017 sent to dr Lequita Haltaluisio in epic.  ADDENDUM:  Final EKG dated 07-27-2017 in epic.

## 2017-08-01 MED ORDER — TRANEXAMIC ACID 1000 MG/10ML IV SOLN
1000.0000 mg | INTRAVENOUS | Status: AC
  Administered 2017-08-02: 1000 mg via INTRAVENOUS
  Filled 2017-08-01: qty 1100

## 2017-08-01 MED ORDER — DEXTROSE 5 % IV SOLN
3.0000 g | INTRAVENOUS | Status: AC
  Administered 2017-08-02: 3 g via INTRAVENOUS
  Filled 2017-08-01: qty 3

## 2017-08-02 ENCOUNTER — Inpatient Hospital Stay (HOSPITAL_COMMUNITY): Payer: 59

## 2017-08-02 ENCOUNTER — Inpatient Hospital Stay (HOSPITAL_COMMUNITY): Payer: 59 | Admitting: Certified Registered"

## 2017-08-02 ENCOUNTER — Encounter (HOSPITAL_COMMUNITY)
Admission: RE | Disposition: A | Discharge status: Home / Self Care | Payer: Self-pay | Source: Ambulatory Visit | Attending: Orthopedic Surgery

## 2017-08-02 ENCOUNTER — Inpatient Hospital Stay (HOSPITAL_COMMUNITY)
Admission: RE | Admit: 2017-08-02 | Discharge: 2017-08-03 | DRG: 470 | Disposition: A | Discharge status: Home / Self Care | Payer: 59 | Source: Ambulatory Visit | Attending: Orthopedic Surgery | Admitting: Orthopedic Surgery

## 2017-08-02 ENCOUNTER — Other Ambulatory Visit: Payer: Self-pay

## 2017-08-02 ENCOUNTER — Encounter (HOSPITAL_COMMUNITY): Payer: Self-pay | Admitting: *Deleted

## 2017-08-02 DIAGNOSIS — Z7982 Long term (current) use of aspirin: Secondary | ICD-10-CM | POA: Diagnosis not present

## 2017-08-02 DIAGNOSIS — M48061 Spinal stenosis, lumbar region without neurogenic claudication: Secondary | ICD-10-CM | POA: Diagnosis present

## 2017-08-02 DIAGNOSIS — G4730 Sleep apnea, unspecified: Secondary | ICD-10-CM | POA: Diagnosis present

## 2017-08-02 DIAGNOSIS — M1612 Unilateral primary osteoarthritis, left hip: Principal | ICD-10-CM | POA: Diagnosis present

## 2017-08-02 DIAGNOSIS — Z981 Arthrodesis status: Secondary | ICD-10-CM

## 2017-08-02 DIAGNOSIS — Z880 Allergy status to penicillin: Secondary | ICD-10-CM

## 2017-08-02 DIAGNOSIS — M169 Osteoarthritis of hip, unspecified: Secondary | ICD-10-CM | POA: Diagnosis present

## 2017-08-02 DIAGNOSIS — Z79899 Other long term (current) drug therapy: Secondary | ICD-10-CM

## 2017-08-02 DIAGNOSIS — I10 Essential (primary) hypertension: Secondary | ICD-10-CM | POA: Diagnosis present

## 2017-08-02 DIAGNOSIS — Z888 Allergy status to other drugs, medicaments and biological substances status: Secondary | ICD-10-CM | POA: Diagnosis not present

## 2017-08-02 DIAGNOSIS — Z96649 Presence of unspecified artificial hip joint: Secondary | ICD-10-CM

## 2017-08-02 HISTORY — PX: TOTAL HIP ARTHROPLASTY: SHX124

## 2017-08-02 HISTORY — DX: Sleep apnea, unspecified: G47.30

## 2017-08-02 SURGERY — ARTHROPLASTY, HIP, TOTAL, ANTERIOR APPROACH
Anesthesia: General | Site: Hip | Laterality: Left

## 2017-08-02 MED ORDER — HYDROMORPHONE HCL 1 MG/ML IJ SOLN
0.2500 mg | INTRAMUSCULAR | Status: DC | PRN
  Administered 2017-08-02 (×4): 0.5 mg via INTRAVENOUS

## 2017-08-02 MED ORDER — PHENOL 1.4 % MT LIQD: 1.0000 | OROMUCOSAL | Status: DC | PRN

## 2017-08-02 MED ORDER — FENTANYL CITRATE (PF) 100 MCG/2ML IJ SOLN
INTRAMUSCULAR | Status: DC | PRN
  Administered 2017-08-02 (×3): 50 ug via INTRAVENOUS
  Administered 2017-08-02: 100 ug via INTRAVENOUS

## 2017-08-02 MED ORDER — EPHEDRINE SULFATE-NACL 50-0.9 MG/10ML-% IV SOSY
PREFILLED_SYRINGE | INTRAVENOUS | Status: DC | PRN
Start: 2017-08-02 — End: 2017-08-02
  Administered 2017-08-02: 10 mg via INTRAVENOUS

## 2017-08-02 MED ORDER — LOSARTAN POTASSIUM 50 MG PO TABS
100.0000 mg | ORAL_TABLET | Freq: Every day | ORAL | Status: DC
  Filled 2017-08-02: qty 2

## 2017-08-02 MED ORDER — FENTANYL CITRATE (PF) 250 MCG/5ML IJ SOLN
INTRAMUSCULAR | Status: AC
  Filled 2017-08-02: qty 5

## 2017-08-02 MED ORDER — MIDAZOLAM HCL 2 MG/2ML IJ SOLN
INTRAMUSCULAR | Status: AC
  Filled 2017-08-02: qty 2

## 2017-08-02 MED ORDER — ACETAMINOPHEN 500 MG PO TABS
1000.0000 mg | ORAL_TABLET | Freq: Four times a day (QID) | ORAL | Status: DC
  Administered 2017-08-02 – 2017-08-03 (×3): 1000 mg via ORAL
  Filled 2017-08-02 (×3): qty 2

## 2017-08-02 MED ORDER — SODIUM CHLORIDE 0.9 % IV SOLN
INTRAVENOUS | Status: DC
  Administered 2017-08-02: 20:00:00 via INTRAVENOUS

## 2017-08-02 MED ORDER — MENTHOL 3 MG MT LOZG: 1.0000 | LOZENGE | OROMUCOSAL | Status: DC | PRN

## 2017-08-02 MED ORDER — FLEET ENEMA 7-19 GM/118ML RE ENEM: 1.0000 | ENEMA | Freq: Once | RECTAL | Status: DC | PRN

## 2017-08-02 MED ORDER — BISACODYL 10 MG RE SUPP: 10.0000 mg | Freq: Every day | RECTAL | Status: DC | PRN

## 2017-08-02 MED ORDER — ONDANSETRON HCL 4 MG PO TABS: 4.0000 mg | ORAL_TABLET | Freq: Four times a day (QID) | ORAL | Status: DC | PRN

## 2017-08-02 MED ORDER — METOCLOPRAMIDE HCL 5 MG PO TABS: 5.0000 mg | ORAL_TABLET | Freq: Three times a day (TID) | ORAL | Status: DC | PRN

## 2017-08-02 MED ORDER — ACETAMINOPHEN 10 MG/ML IV SOLN
1000.0000 mg | Freq: Four times a day (QID) | INTRAVENOUS | Status: DC
  Administered 2017-08-02: 1000 mg via INTRAVENOUS
  Filled 2017-08-02: qty 100

## 2017-08-02 MED ORDER — HYDROMORPHONE HCL 1 MG/ML IJ SOLN
INTRAMUSCULAR | Status: AC
Start: 2017-08-02 — End: 2017-08-03
  Filled 2017-08-02: qty 1

## 2017-08-02 MED ORDER — ONDANSETRON HCL 4 MG/2ML IJ SOLN
INTRAMUSCULAR | Status: AC
  Filled 2017-08-02: qty 2

## 2017-08-02 MED ORDER — SUGAMMADEX SODIUM 500 MG/5ML IV SOLN
INTRAVENOUS | Status: AC
  Filled 2017-08-02: qty 5

## 2017-08-02 MED ORDER — CEFAZOLIN SODIUM-DEXTROSE 2-4 GM/100ML-% IV SOLN
2.0000 g | Freq: Four times a day (QID) | INTRAVENOUS | Status: AC
  Administered 2017-08-03 (×2): 2 g via INTRAVENOUS
  Filled 2017-08-02 (×2): qty 100

## 2017-08-02 MED ORDER — LACTATED RINGERS IV SOLN
INTRAVENOUS | Status: DC
  Administered 2017-08-02 (×3): via INTRAVENOUS

## 2017-08-02 MED ORDER — FLUTICASONE PROPIONATE 50 MCG/ACT NA SUSP
1.0000 | Freq: Every day | NASAL | Status: DC | PRN
  Filled 2017-08-02: qty 16

## 2017-08-02 MED ORDER — AMITRIPTYLINE HCL 100 MG PO TABS
200.0000 mg | ORAL_TABLET | Freq: Every day | ORAL | Status: DC
  Administered 2017-08-02: 200 mg via ORAL
  Filled 2017-08-02: qty 2

## 2017-08-02 MED ORDER — HYDROMORPHONE HCL 1 MG/ML IJ SOLN
0.5000 mg | INTRAMUSCULAR | Status: AC | PRN
  Administered 2017-08-02 (×2): 0.5 mg via INTRAVENOUS

## 2017-08-02 MED ORDER — MIDAZOLAM HCL 2 MG/2ML IJ SOLN
1.0000 mg | Freq: Once | INTRAMUSCULAR | Status: AC
  Administered 2017-08-02: 1 mg via INTRAVENOUS

## 2017-08-02 MED ORDER — CHLORHEXIDINE GLUCONATE 4 % EX LIQD: 60.0000 mL | Freq: Once | CUTANEOUS | Status: DC

## 2017-08-02 MED ORDER — POLYETHYLENE GLYCOL 3350 17 G PO PACK: 17.0000 g | PACK | Freq: Every day | ORAL | Status: DC | PRN

## 2017-08-02 MED ORDER — STERILE WATER FOR IRRIGATION IR SOLN
Status: DC | PRN
  Administered 2017-08-02: 2000 mL

## 2017-08-02 MED ORDER — PROPOFOL 10 MG/ML IV BOLUS
INTRAVENOUS | Status: AC
  Filled 2017-08-02: qty 20

## 2017-08-02 MED ORDER — HYDROMORPHONE HCL 1 MG/ML IJ SOLN
INTRAMUSCULAR | Status: AC
  Filled 2017-08-02: qty 1

## 2017-08-02 MED ORDER — ONDANSETRON HCL 4 MG/2ML IJ SOLN: 4.0000 mg | Freq: Once | INTRAMUSCULAR | Status: DC | PRN

## 2017-08-02 MED ORDER — 0.9 % SODIUM CHLORIDE (POUR BTL) OPTIME
TOPICAL | Status: DC | PRN
  Administered 2017-08-02: 1000 mL

## 2017-08-02 MED ORDER — OXYCODONE HCL 5 MG PO TABS
10.0000 mg | ORAL_TABLET | ORAL | Status: DC | PRN
  Administered 2017-08-03: 15 mg via ORAL
  Filled 2017-08-02 (×2): qty 3

## 2017-08-02 MED ORDER — HYDROMORPHONE HCL 1 MG/ML IJ SOLN: 0.5000 mg | INTRAMUSCULAR | Status: DC | PRN

## 2017-08-02 MED ORDER — VENLAFAXINE HCL ER 75 MG PO CP24
75.0000 mg | ORAL_CAPSULE | Freq: Every day | ORAL | Status: DC
  Administered 2017-08-03: 75 mg via ORAL
  Filled 2017-08-02 (×2): qty 1

## 2017-08-02 MED ORDER — LOSARTAN POTASSIUM-HCTZ 100-25 MG PO TABS: 1.0000 | ORAL_TABLET | Freq: Every day | ORAL | Status: DC

## 2017-08-02 MED ORDER — METOCLOPRAMIDE HCL 5 MG/ML IJ SOLN: 5.0000 mg | Freq: Three times a day (TID) | INTRAMUSCULAR | Status: DC | PRN

## 2017-08-02 MED ORDER — HYDROCHLOROTHIAZIDE 25 MG PO TABS
25.0000 mg | ORAL_TABLET | Freq: Every day | ORAL | Status: DC
  Filled 2017-08-02: qty 1

## 2017-08-02 MED ORDER — ONDANSETRON HCL 4 MG/2ML IJ SOLN: 4.0000 mg | Freq: Four times a day (QID) | INTRAMUSCULAR | Status: DC | PRN

## 2017-08-02 MED ORDER — MIDAZOLAM HCL 5 MG/5ML IJ SOLN
INTRAMUSCULAR | Status: DC | PRN
  Administered 2017-08-02: 2 mg via INTRAVENOUS

## 2017-08-02 MED ORDER — DEXAMETHASONE SODIUM PHOSPHATE 10 MG/ML IJ SOLN
8.0000 mg | Freq: Once | INTRAMUSCULAR | Status: AC
  Administered 2017-08-02: 10 mg via INTRAVENOUS

## 2017-08-02 MED ORDER — DIPHENHYDRAMINE HCL 12.5 MG/5ML PO ELIX: 12.5000 mg | ORAL_SOLUTION | ORAL | Status: DC | PRN

## 2017-08-02 MED ORDER — LIDOCAINE 2% (20 MG/ML) 5 ML SYRINGE
INTRAMUSCULAR | Status: DC | PRN
  Administered 2017-08-02: 100 mg via INTRAVENOUS

## 2017-08-02 MED ORDER — LACTATED RINGERS IV SOLN: INTRAVENOUS | Status: DC

## 2017-08-02 MED ORDER — EPHEDRINE 5 MG/ML INJ
INTRAVENOUS | Status: AC
  Filled 2017-08-02: qty 10

## 2017-08-02 MED ORDER — METHOCARBAMOL 1000 MG/10ML IJ SOLN
500.0000 mg | Freq: Four times a day (QID) | INTRAMUSCULAR | Status: DC | PRN
  Administered 2017-08-02: 500 mg via INTRAVENOUS
  Filled 2017-08-02: qty 550

## 2017-08-02 MED ORDER — SUGAMMADEX SODIUM 500 MG/5ML IV SOLN
INTRAVENOUS | Status: DC | PRN
  Administered 2017-08-02: 275 mg via INTRAVENOUS

## 2017-08-02 MED ORDER — DEXAMETHASONE SODIUM PHOSPHATE 10 MG/ML IJ SOLN
INTRAMUSCULAR | Status: AC
  Filled 2017-08-02: qty 1

## 2017-08-02 MED ORDER — ROCURONIUM BROMIDE 10 MG/ML (PF) SYRINGE
PREFILLED_SYRINGE | INTRAVENOUS | Status: DC | PRN
  Administered 2017-08-02: 10 mg via INTRAVENOUS
  Administered 2017-08-02: 60 mg via INTRAVENOUS
  Administered 2017-08-02: 10 mg via INTRAVENOUS

## 2017-08-02 MED ORDER — ONDANSETRON HCL 4 MG/2ML IJ SOLN
INTRAMUSCULAR | Status: DC | PRN
  Administered 2017-08-02: 4 mg via INTRAVENOUS

## 2017-08-02 MED ORDER — MEPERIDINE HCL 50 MG/ML IJ SOLN: 6.2500 mg | INTRAMUSCULAR | Status: DC | PRN

## 2017-08-02 MED ORDER — ASPIRIN EC 325 MG PO TBEC
325.0000 mg | DELAYED_RELEASE_TABLET | Freq: Two times a day (BID) | ORAL | Status: DC
  Administered 2017-08-03: 325 mg via ORAL
  Filled 2017-08-02: qty 1

## 2017-08-02 MED ORDER — PHENYLEPHRINE 40 MCG/ML (10ML) SYRINGE FOR IV PUSH (FOR BLOOD PRESSURE SUPPORT)
PREFILLED_SYRINGE | INTRAVENOUS | Status: AC
  Filled 2017-08-02: qty 10

## 2017-08-02 MED ORDER — OXYCODONE HCL 5 MG PO TABS: 5.0000 mg | ORAL_TABLET | ORAL | Status: DC | PRN

## 2017-08-02 MED ORDER — DOCUSATE SODIUM 100 MG PO CAPS
100.0000 mg | ORAL_CAPSULE | Freq: Two times a day (BID) | ORAL | Status: DC
  Administered 2017-08-02 – 2017-08-03 (×2): 100 mg via ORAL
  Filled 2017-08-02 (×2): qty 1

## 2017-08-02 MED ORDER — DEXAMETHASONE SODIUM PHOSPHATE 10 MG/ML IJ SOLN
10.0000 mg | Freq: Once | INTRAMUSCULAR | Status: AC
  Administered 2017-08-03: 10 mg via INTRAVENOUS
  Filled 2017-08-02: qty 1

## 2017-08-02 MED ORDER — BUPIVACAINE-EPINEPHRINE (PF) 0.25% -1:200000 IJ SOLN
INTRAMUSCULAR | Status: AC
  Filled 2017-08-02: qty 30

## 2017-08-02 MED ORDER — BUPIVACAINE-EPINEPHRINE (PF) 0.25% -1:200000 IJ SOLN
INTRAMUSCULAR | Status: DC | PRN
  Administered 2017-08-02: 30 mL

## 2017-08-02 MED ORDER — PROPOFOL 10 MG/ML IV BOLUS
INTRAVENOUS | Status: DC | PRN
  Administered 2017-08-02: 200 mg via INTRAVENOUS

## 2017-08-02 MED ORDER — METHOCARBAMOL 500 MG PO TABS: 500.0000 mg | ORAL_TABLET | Freq: Four times a day (QID) | ORAL | Status: DC | PRN

## 2017-08-02 MED ORDER — TRANEXAMIC ACID 1000 MG/10ML IV SOLN
1000.0000 mg | Freq: Once | INTRAVENOUS | Status: AC
  Administered 2017-08-02: 1000 mg via INTRAVENOUS
  Filled 2017-08-02: qty 1100

## 2017-08-02 MED ORDER — PHENYLEPHRINE 40 MCG/ML (10ML) SYRINGE FOR IV PUSH (FOR BLOOD PRESSURE SUPPORT)
PREFILLED_SYRINGE | INTRAVENOUS | Status: DC | PRN
  Administered 2017-08-02 (×2): 80 ug via INTRAVENOUS

## 2017-08-02 SURGICAL SUPPLY — 40 items
BAG DECANTER FOR FLEXI CONT (MISCELLANEOUS) IMPLANT
BAG ZIPLOCK 12X15 (MISCELLANEOUS) IMPLANT
BLADE EXTENDED COATED 6.5IN (ELECTRODE) IMPLANT
BLADE SAG 18X100X1.27 (BLADE) ×3 IMPLANT
CAPT HIP TOTAL 2 ×3 IMPLANT
CLOSURE WOUND 1/2 X4 (GAUZE/BANDAGES/DRESSINGS) ×2
CLOTH BEACON ORANGE TIMEOUT ST (SAFETY) ×3 IMPLANT
COVER PERINEAL POST (MISCELLANEOUS) ×3 IMPLANT
COVER SURGICAL LIGHT HANDLE (MISCELLANEOUS) ×3 IMPLANT
DECANTER SPIKE VIAL GLASS SM (MISCELLANEOUS) ×3 IMPLANT
DRAPE STERI IOBAN 125X83 (DRAPES) ×3 IMPLANT
DRAPE U-SHAPE 47X51 STRL (DRAPES) ×6 IMPLANT
DRSG ADAPTIC 3X8 NADH LF (GAUZE/BANDAGES/DRESSINGS) ×3 IMPLANT
DRSG MEPILEX BORDER 4X4 (GAUZE/BANDAGES/DRESSINGS) ×3 IMPLANT
DRSG MEPILEX BORDER 4X8 (GAUZE/BANDAGES/DRESSINGS) ×3 IMPLANT
DURAPREP 26ML APPLICATOR (WOUND CARE) ×3 IMPLANT
ELECT REM PT RETURN 15FT ADLT (MISCELLANEOUS) ×3 IMPLANT
EVACUATOR 1/8 PVC DRAIN (DRAIN) ×3 IMPLANT
GLOVE BIO SURGEON STRL SZ7 (GLOVE) IMPLANT
GLOVE BIO SURGEON STRL SZ8 (GLOVE) ×6 IMPLANT
GLOVE BIOGEL PI IND STRL 6.5 (GLOVE) ×1 IMPLANT
GLOVE BIOGEL PI IND STRL 7.0 (GLOVE) IMPLANT
GLOVE BIOGEL PI IND STRL 8 (GLOVE) ×1 IMPLANT
GLOVE BIOGEL PI INDICATOR 6.5 (GLOVE) ×2
GLOVE BIOGEL PI INDICATOR 7.0 (GLOVE)
GLOVE BIOGEL PI INDICATOR 8 (GLOVE) ×2
GLOVE SURG SS PI 6.5 STRL IVOR (GLOVE) ×3 IMPLANT
GOWN STRL REUS W/TWL LRG LVL3 (GOWN DISPOSABLE) ×6 IMPLANT
GOWN STRL REUS W/TWL XL LVL3 (GOWN DISPOSABLE) IMPLANT
PACK ANTERIOR HIP CUSTOM (KITS) ×3 IMPLANT
STRIP CLOSURE SKIN 1/2X4 (GAUZE/BANDAGES/DRESSINGS) ×4 IMPLANT
SUT ETHIBOND NAB CT1 #1 30IN (SUTURE) ×3 IMPLANT
SUT MNCRL AB 4-0 PS2 18 (SUTURE) ×3 IMPLANT
SUT STRATAFIX 0 PDS 27 VIOLET (SUTURE) ×3
SUT VIC AB 2-0 CT1 27 (SUTURE) ×4
SUT VIC AB 2-0 CT1 TAPERPNT 27 (SUTURE) ×2 IMPLANT
SUTURE STRATFX 0 PDS 27 VIOLET (SUTURE) ×1 IMPLANT
SYR 50ML LL SCALE MARK (SYRINGE) IMPLANT
TRAY FOLEY MTR SLVR 16FR STAT (SET/KITS/TRAYS/PACK) ×3 IMPLANT
YANKAUER SUCT BULB TIP 10FT TU (MISCELLANEOUS) ×3 IMPLANT

## 2017-08-02 NOTE — Anesthesia Postprocedure Evaluation (Signed)
Anesthesia Post Note  Patient: Terrall LaityJeffrey D Dorning  Procedure(s) Performed: LEFT TOTAL HIP ARTHROPLASTY ANTERIOR APPROACH (Left Hip)     Patient location during evaluation: PACU Anesthesia Type: General Level of consciousness: awake and alert Pain management: pain level controlled Vital Signs Assessment: post-procedure vital signs reviewed and stable Respiratory status: spontaneous breathing, nonlabored ventilation, respiratory function stable and patient connected to nasal cannula oxygen Cardiovascular status: blood pressure returned to baseline and stable Postop Assessment: no apparent nausea or vomiting Anesthetic complications: no    Last Vitals:  Vitals:   08/02/17 1730 08/02/17 1745  BP: (!) 151/75 (!) 165/85  Pulse: 95 98  Resp: 17 10  Temp: 37.2 C   SpO2: 100% 98%    Last Pain:  Vitals:   08/02/17 1730  TempSrc:   PainSc: 7                  Tres Grzywacz DAVID

## 2017-08-02 NOTE — Anesthesia Preprocedure Evaluation (Signed)
Anesthesia Evaluation  Patient identified by MRN, date of birth, ID band Patient awake    Airway Mallampati: I  TM Distance: >3 FB Neck ROM: Full    Dental   Pulmonary sleep apnea ,    Pulmonary exam normal        Cardiovascular hypertension, Pt. on medications Normal cardiovascular exam     Neuro/Psych Anxiety    GI/Hepatic   Endo/Other    Renal/GU      Musculoskeletal   Abdominal   Peds  Hematology   Anesthesia Other Findings   Reproductive/Obstetrics                             Anesthesia Physical Anesthesia Plan  ASA: III  Anesthesia Plan: General   Post-op Pain Management:    Induction: Intravenous  PONV Risk Score and Plan: 2 and Ondansetron and Midazolam  Airway Management Planned: LMA  Additional Equipment:   Intra-op Plan:   Post-operative Plan: Extubation in OR  Informed Consent: I have reviewed the patients History and Physical, chart, labs and discussed the procedure including the risks, benefits and alternatives for the proposed anesthesia with the patient or authorized representative who has indicated his/her understanding and acceptance.     Plan Discussed with: CRNA and Surgeon  Anesthesia Plan Comments:         Anesthesia Quick Evaluation

## 2017-08-02 NOTE — Anesthesia Procedure Notes (Signed)
Procedure Name: Intubation Date/Time: 08/02/2017 3:40 PM Performed by: Kindell Strada D, CRNA Pre-anesthesia Checklist: Patient identified, Emergency Drugs available, Suction available and Patient being monitored Patient Re-evaluated:Patient Re-evaluated prior to induction Oxygen Delivery Method: Circle system utilized Preoxygenation: Pre-oxygenation with 100% oxygen Induction Type: IV induction Ventilation: Mask ventilation without difficulty Laryngoscope Size: Mac and 4 Grade View: Grade I Tube type: Oral Tube size: 7.5 mm Number of attempts: 1 Airway Equipment and Method: Stylet Placement Confirmation: ETT inserted through vocal cords under direct vision,  positive ETCO2 and breath sounds checked- equal and bilateral Secured at: 22 cm Tube secured with: Tape Dental Injury: Teeth and Oropharynx as per pre-operative assessment

## 2017-08-02 NOTE — Interval H&P Note (Signed)
History and Physical Interval Note:  08/02/2017 12:08 PM  Hunter Swanson  has presented today for surgery, with the diagnosis of Left hip osteoarthritis  The various methods of treatment have been discussed with the patient and family. After consideration of risks, benefits and other options for treatment, the patient has consented to  Procedure(s): LEFT TOTAL HIP ARTHROPLASTY ANTERIOR APPROACH (Left) as a surgical intervention .  The patient's history has been reviewed, patient examined, no change in status, stable for surgery.  I have reviewed the patient's chart and labs.  Questions were answered to the patient's satisfaction.     [name]

## 2017-08-02 NOTE — Op Note (Signed)
OPERATIVE REPORT- TOTAL HIP ARTHROPLASTY   PREOPERATIVE DIAGNOSIS: Osteoarthritis of the Left hip.   POSTOPERATIVE DIAGNOSIS: Osteoarthritis of the Left  hip.   PROCEDURE: Left total hip arthroplasty, anterior approach.   SURGEON: Ollen Gross, MD   ASSISTANT: Dimitri Ped, PA-C  ANESTHESIA:  General  ESTIMATED BLOOD LOSS:-600 mL    DRAINS: Hemovac x1.   COMPLICATIONS: None   CONDITION: PACU - hemodynamically stable.   BRIEF CLINICAL NOTE: Hunter Swanson is a 57 y.o. male who has advanced end-  stage arthritis of their Left  hip with progressively worsening pain and  dysfunction.The patient has failed nonoperative management and presents for  total hip arthroplasty.   PROCEDURE IN DETAIL: After successful administration of spinal  anesthetic, the traction boots for the [facility] bed were placed on both  feet and the patient was placed onto the [facility] bed, boots placed into the leg  holders. The Left hip was then isolated from the perineum with plastic  drapes and prepped and draped in the usual sterile fashion. ASIS and  greater trochanter were marked and a oblique incision was made, starting  at about 1 cm lateral and 2 cm distal to the ASIS and coursing towards  the anterior cortex of the femur. The skin was cut with a 10 blade  through subcutaneous tissue to the level of the fascia overlying the  tensor fascia lata muscle. The fascia was then incised in line with the  incision at the junction of the anterior third and posterior 2/3rd. The  muscle was teased off the fascia and then the interval between the TFL  and the rectus was developed. The Hohmann retractor was then placed at  the top of the femoral neck over the capsule. The vessels overlying the  capsule were cauterized and the fat on top of the capsule was removed.  A Hohmann retractor was then placed anterior underneath the rectus  femoris to give exposure to the entire anterior capsule. A T-shaped   capsulotomy was performed. The edges were tagged and the femoral head  was identified.       Osteophytes are removed off the superior acetabulum.  The femoral neck was then cut in situ with an oscillating saw. Traction  was then applied to the left lower extremity utilizing the [facility]  traction. The femoral head was then removed. Retractors were placed  around the acetabulum and then circumferential removal of the labrum was  performed. Osteophytes were also removed. Reaming starts at 49 mm to  medialize and  Increased in 2 mm increments to 53 mm. We reamed in  approximately 40 degrees of abduction, 20 degrees anteversion. A 54 mm  pinnacle acetabular shell was then impacted in anatomic position under  fluoroscopic guidance with excellent purchase. We did not need to place  any additional dome screws. A 36 mm neutral + 4 marathon liner was then  placed into the acetabular shell.       The femoral lift was then placed along the lateral aspect of the femur  just distal to the vastus ridge. The leg was  externally rotated and capsule  was stripped off the inferior aspect of the femoral neck down to the  level of the lesser trochanter, this was done with electrocautery. The femur was lifted after this was performed. The  leg was then placed in an extended and adducted position essentially delivering the femur. We also removed the capsule superiorly and the piriformis from the piriformis  fossa to gain excellent exposure of the  proximal femur. Rongeur was used to remove some cancellous bone to get  into the lateral portion of the proximal femur for placement of the  initial starter reamer. The starter broaches was placed  the starter broach  and was shown to go down the center of the canal. Broaching  with the  Actis system was then performed starting at size 0, coursing  Up to size 8. A size 8 had excellent torsional and rotational  and axial stability. The trial high offset neck was then placed   with a 36 + 1.5 trial head. The hip was then reduced. We confirmed that  the stem was in the canal both on AP and lateral x-rays. It also has excellent sizing. The hip was reduced with outstanding stability through full extension and full external rotation.. AP pelvis was taken and the leg lengths were measured and found to be equal. Hip was then dislocated again and the femoral head and neck removed. The  femoral broach was removed. Size 8 Actis stem with a high offset  neck was then impacted into the femur following native anteversion. Has  excellent purchase in the canal. Excellent torsional and rotational and  axial stability. It is confirmed to be in the canal on AP and lateral  fluoroscopic views. The 36 + 1.5 ceramic head was placed and the hip  reduced with outstanding stability. Again AP pelvis was taken and it  confirmed that the leg lengths were equal. The wound was then copiously  irrigated with saline solution and the capsule reattached and repaired  with Ethibond suture. 30 ml of .25% Bupivicaine was  injected into the capsule and into the edge of the tensor fascia lata as well as subcutaneous tissue. The fascia overlying the tensor fascia lata was then closed with a running #1 V-Loc. Subcu was closed with interrupted 2-0 Vicryl and subcuticular running 4-0 Monocryl. Incision was cleaned  and dried. Steri-Strips and a bulky sterile dressing applied. Hemovac  drain was hooked to suction and then the patient was awakened and transported to  recovery in stable condition.        Please note that a surgical assistant was a medical necessity for this procedure to perform it in a safe and expeditious manner. Assistant was necessary to provide appropriate retraction of vital neurovascular structures and to prevent femoral fracture and allow for anatomic placement of the prosthesis.  Ollen GrossFrank Wisdom Rickey, M.D.

## 2017-08-02 NOTE — Transfer of Care (Signed)
Immediate Anesthesia Transfer of Care Note  Patient: Hunter LaityJeffrey D Swanson  Procedure(s) Performed: LEFT TOTAL HIP ARTHROPLASTY ANTERIOR APPROACH (Left Hip)  Patient Location: PACU  Anesthesia Type:General  Level of Consciousness: awake, alert  and oriented  Airway & Oxygen Therapy: Patient Spontanous Breathing and Patient connected to face mask oxygen  Post-op Assessment: Report given to RN and Post -op Vital signs reviewed and stable  Post vital signs: Reviewed and stable  Last Vitals:  Vitals Value Taken Time  BP 151/75 08/02/2017  5:30 PM  Temp    Pulse 94 08/02/2017  5:32 PM  Resp 13 08/02/2017  5:32 PM  SpO2 100 % 08/02/2017  5:32 PM  Vitals shown include unvalidated device data.  Last Pain:  Vitals:   08/02/17 1204  TempSrc:   PainSc: 0-No pain         Complications: No apparent anesthesia complications

## 2017-08-02 NOTE — Discharge Instructions (Signed)
° °Dr. Frank Aluisio °Total Joint Specialist °[facility] °[address] °[place], [place] [address] °[phone] ° °ANTERIOR APPROACH TOTAL HIP REPLACEMENT POSTOPERATIVE DIRECTIONS ° ° °Hip Rehabilitation, Guidelines Following Surgery  °The results of a hip operation are greatly improved after range of motion and muscle strengthening exercises. Follow all safety measures which are given to protect your hip. If any of these exercises cause increased pain or swelling in your joint, decrease the amount until you are comfortable again. Then slowly increase the exercises. Call your caregiver if you have problems or questions.  ° °HOME CARE INSTRUCTIONS  °Remove items at home which could result in a fall. This includes throw rugs or furniture in walking pathways.  °· ICE to the affected hip every three hours for 30 minutes at a time and then as needed for pain and swelling.  Continue to use ice on the hip for pain and swelling from surgery. You may notice swelling that will progress down to the foot and ankle.  This is normal after surgery.  Elevate the leg when you are not up walking on it.   °· Continue to use the breathing machine which will help keep your temperature down.  It is common for your temperature to cycle up and down following surgery, especially at night when you are not up moving around and exerting yourself.  The breathing machine keeps your lungs expanded and your temperature down. ° ° °DIET °You may resume your previous home diet once your are discharged from the hospital. ° °DRESSING / WOUND CARE / SHOWERING °You may change your dressing every day with sterile gauze.  Please use good hand washing techniques before changing the dressing.  Do not use any lotions or creams on the incision until instructed by your surgeon. °You may start showering once you are discharged home but do not submerge the incision under water. Just pat the incision dry and apply a dry gauze dressing on  daily. °Change the surgical dressing daily and reapply a dry dressing each time. ° °ACTIVITY °Walk with your walker as instructed. °Use walker as long as suggested by your caregivers. °Avoid periods of inactivity such as sitting longer than an hour when not asleep. This helps prevent blood clots.  °You may resume a sexual relationship in one month or when given the OK by your doctor.  °You may return to work once you are cleared by your doctor.  °Do not drive a car for 6 weeks or until released by you surgeon.  °Do not drive while taking narcotics. ° °WEIGHT BEARING °Weight bearing as tolerated with assist device (walker, cane, etc) as directed, use it as long as suggested by your surgeon or therapist, typically at least 4-6 weeks. ° °POSTOPERATIVE CONSTIPATION PROTOCOL °Constipation - defined medically as fewer than three stools per week and severe constipation as less than one stool per week. ° °One of the most common issues patients have following surgery is constipation.  Even if you have a regular bowel pattern at home, your normal regimen is likely to be disrupted due to multiple reasons following surgery.  Combination of anesthesia, postoperative narcotics, change in appetite and fluid intake all can affect your bowels.  In order to avoid complications following surgery, here are some recommendations in order to help you during your recovery period. ° °Colace (docusate) - Pick up an over-the-counter form of Colace or another stool softener and take twice a day as long as you are requiring postoperative pain   medications.  Take with a full glass of water daily.  If you experience loose stools or diarrhea, hold the colace until you stool forms back up.  If your symptoms do not get better within 1 week or if they get worse, check with your doctor. ° °Dulcolax (bisacodyl) - Pick up over-the-counter and take as directed by the product packaging as needed to assist with the movement of your bowels.  Take with a full  glass of water.  Use this product as needed if not relieved by Colace only.  ° °MiraLax (polyethylene glycol) - Pick up over-the-counter to have on hand.  MiraLax is a solution that will increase the amount of water in your bowels to assist with bowel movements.  Take as directed and can mix with a glass of water, juice, soda, coffee, or tea.  Take if you go more than two days without a movement. °Do not use MiraLax more than once per day. Call your doctor if you are still constipated or irregular after using this medication for 7 days in a row. ° °If you continue to have problems with postoperative constipation, please contact the office for further assistance and recommendations.  If you experience "the worst abdominal pain ever" or develop nausea or vomiting, please contact the office immediatly for further recommendations for treatment. ° °ITCHING ° If you experience itching with your medications, try taking only a single pain pill, or even half a pain pill at a time.  You can also use Benadryl over the counter for itching or also to help with sleep.  ° °TED HOSE STOCKINGS °Wear the elastic stockings on both legs for three weeks following surgery during the day but you may remove then at night for sleeping. ° °MEDICATIONS °See your medication summary on the “After Visit Summary” that the nursing staff will review with you prior to discharge.  You may have some home medications which will be placed on hold until you complete the course of blood thinner medication.  It is important for you to complete the blood thinner medication as prescribed by your surgeon.  Continue your approved medications as instructed at time of discharge. ° °PRECAUTIONS °If you experience chest pain or shortness of breath - call 911 immediately for transfer to the hospital emergency department.  °If you develop a fever greater that 101 F, purulent drainage from wound, increased redness or drainage from wound, foul odor from the  wound/dressing, or calf pain - CONTACT YOUR SURGEON.   °                                                °FOLLOW-UP APPOINTMENTS °Make sure you keep all of your appointments after your operation with your surgeon and caregivers. You should call the office at the above phone number and make an appointment for approximately two weeks after the date of your surgery or on the date instructed by your surgeon outlined in the "After Visit Summary". ° °RANGE OF MOTION AND STRENGTHENING EXERCISES  °These exercises are designed to help you keep full movement of your hip joint. Follow your caregiver's or physical therapist's instructions. Perform all exercises about fifteen times, three times per day or as directed. Exercise both hips, even if you have had only one joint replacement. These exercises can be done on a training (exercise) mat, on the floor, on   a table or on a bed. Use whatever works the best and is most comfortable for you. Use music or television while you are exercising so that the exercises are a pleasant break in your day. This will make your life better with the exercises acting as a break in routine you can look forward to.  °Lying on your back, slowly slide your foot toward your buttocks, raising your knee up off the floor. Then slowly slide your foot back down until your leg is straight again.  °Lying on your back spread your legs as far apart as you can without causing discomfort.  °Lying on your side, raise your upper leg and foot straight up from the floor as far as is comfortable. Slowly lower the leg and repeat.  °Lying on your back, tighten up the muscle in the front of your thigh (quadriceps muscles). You can do this by keeping your leg straight and trying to raise your heel off the floor. This helps strengthen the largest muscle supporting your knee.  °Lying on your back, tighten up the muscles of your buttocks both with the legs straight and with the knee bent at a comfortable angle while keeping  your heel on the floor.  ° °IF YOU ARE TRANSFERRED TO A SKILLED REHAB FACILITY °If the patient is transferred to a skilled rehab facility following release from the hospital, a list of the current medications will be sent to the facility for the patient to continue.  When discharged from the skilled rehab facility, please have the facility set up the patient's Home Health Physical Therapy prior to being released. Also, the skilled facility will be responsible for providing the patient with their medications at time of release from the facility to include their pain medication, the muscle relaxants, and their blood thinner medication. If the patient is still at the rehab facility at time of the two week follow up appointment, the skilled rehab facility will also need to assist the patient in arranging follow up appointment in our office and any transportation needs. ° °MAKE SURE YOU:  °Understand these instructions.  °Get help right away if you are not doing well or get worse.  ° ° °Pick up stool softner and laxative for home use following surgery while on pain medications. °Do not submerge incision under water. °Please use good hand washing techniques while changing dressing each day. °May shower starting three days after surgery. °Please use a clean towel to pat the incision dry following showers. °Continue to use ice for pain and swelling after surgery. °Do not use any lotions or creams on the incision until instructed by your surgeon. °

## 2017-08-03 ENCOUNTER — Encounter (HOSPITAL_COMMUNITY): Payer: Self-pay

## 2017-08-03 LAB — CBC
HEMATOCRIT: 36.5 % — AB (ref 39.0–52.0)
HEMOGLOBIN: 12.1 g/dL — AB (ref 13.0–17.0)
MCH: 29.2 pg (ref 26.0–34.0)
MCHC: 33.2 g/dL (ref 30.0–36.0)
MCV: 88.2 fL (ref 78.0–100.0)
Platelets: 302 10*3/uL (ref 150–400)
RBC: 4.14 MIL/uL — ABNORMAL LOW (ref 4.22–5.81)
RDW: 15.6 % — ABNORMAL HIGH (ref 11.5–15.5)
WBC: 14.2 10*3/uL — ABNORMAL HIGH (ref 4.0–10.5)

## 2017-08-03 LAB — BASIC METABOLIC PANEL
Anion gap: 11 (ref 5–15)
BUN: 17 mg/dL (ref 6–20)
CHLORIDE: 101 mmol/L (ref 98–111)
CO2: 23 mmol/L (ref 22–32)
Calcium: 8.5 mg/dL — ABNORMAL LOW (ref 8.9–10.3)
Creatinine, Ser: 1.27 mg/dL — ABNORMAL HIGH (ref 0.61–1.24)
GFR calc non Af Amer: >60 mL/min (ref >60)
Glucose, Bld: 164 mg/dL — ABNORMAL HIGH (ref 70–99)
Potassium: 5.2 mmol/L — ABNORMAL HIGH (ref 3.5–5.1)
Sodium: 135 mmol/L (ref 135–145)

## 2017-08-03 LAB — TYPE AND SCREEN
ABO/RH(D): O POS
ANTIBODY SCREEN: NEGATIVE

## 2017-08-03 MED ORDER — ASPIRIN 325 MG PO TBEC: 325.0000 mg | DELAYED_RELEASE_TABLET | Freq: Two times a day (BID) | ORAL | 0 refills | Status: AC

## 2017-08-03 MED ORDER — METHOCARBAMOL 500 MG PO TABS: 500.0000 mg | ORAL_TABLET | Freq: Four times a day (QID) | ORAL | 0 refills | Status: DC | PRN

## 2017-08-03 MED ORDER — OXYCODONE HCL 5 MG PO TABS: 5.0000 mg | ORAL_TABLET | ORAL | 0 refills | Status: DC | PRN

## 2017-08-03 MED ORDER — HYDROCHLOROTHIAZIDE 25 MG PO TABS: 25.0000 mg | ORAL_TABLET | Freq: Every day | ORAL | Status: DC

## 2017-08-03 MED ORDER — LOSARTAN POTASSIUM 50 MG PO TABS: 100.0000 mg | ORAL_TABLET | Freq: Every day | ORAL | Status: DC

## 2017-08-03 NOTE — Progress Notes (Signed)
Minimal complaints of pain, controlled with PO pain medication. Vitals stable.

## 2017-08-03 NOTE — Progress Notes (Signed)
   Subjective: 1 Day Post-Op Procedure(s) (LRB): LEFT TOTAL HIP ARTHROPLASTY ANTERIOR APPROACH (Left) Patient reports pain as mild.   Patient seen in rounds by Dr. Lequita HaltAluisio. Patient is well, and has had no acute complaints or problems other than pain the left hip. Pt states he is ready to go home. Foley catheter removed this AM. Denies chest pain or SOB.  We will start therapy today.   Objective: Vital signs in last 24 hours: Temp:  [98.2 F (36.8 C)-99.2 F (37.3 C)] 98.2 F (36.8 C) (07/09 0636) Pulse Rate:  [85-103] 85 (07/09 0636) Resp:  [10-20] 17 (07/09 0636) BP: (101-176)/(69-94) 101/78 (07/09 0636) SpO2:  [95 %-100 %] 97 % (07/09 0636) Weight:  [138.3 kg (305 lb)] 138.3 kg (305 lb) (07/09 0636)  Intake/Output from previous day:  Intake/Output Summary (Last 24 hours) at 08/03/2017 0746 Last data filed at 08/03/2017 0620 Gross per 24 hour  Intake 3520 ml  Output 4250 ml  Net -730 ml    Labs: Recent Labs    08/03/17 0524  HGB 12.1*   Recent Labs    08/03/17 0524  WBC 14.2*  RBC 4.14*  HCT 36.5*  PLT 302   Recent Labs    08/03/17 0524  NA 135  K 5.2*  CL 101  CO2 23  BUN 17  CREATININE 1.27*  GLUCOSE 164*  CALCIUM 8.5*   Exam: General - Patient is Alert and Oriented Extremity - Neurologically intact Neurovascular intact Sensation intact distally Dorsiflexion/Plantar flexion intact Dressing - dressing C/D/I Motor Function - intact, moving foot and toes well on exam.   Past Medical History:  Diagnosis Date  . Anxiety   . Baker's cyst of knee, left    Small  . Chronic pain of left knee   . History of retinal detachment    right eye s/p repair 03-18-2016  . Hypertension   . 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 OA (osteoarthritis)    knees, hips  . Sleep apnea   . Spinal stenosis, lumbar   . Wears contact lenses     Assessment/Plan: 1 Day Post-Op Procedure(s) (LRB): LEFT TOTAL HIP ARTHROPLASTY ANTERIOR APPROACH (Left) Principal  Problem:   OA (osteoarthritis) of hip  Estimated body mass index is 38.12 kg/m as calculated from the following:   Height as of this encounter: 6\' 3"  (1.905 m).   Weight as of this encounter: 138.3 kg (305 lb). Advance diet Up with therapy D/C IV fluids  DVT Prophylaxis - Aspirin Weight bearing as tolerated. D/C O2 and pulse ox and try on room air. Hemovac pulled without difficulty, will begin therapy.  Plan is to go Home after hospital stay [facility]. Plan for discharge today if progresses with therapy and meeting goals. Follow-up in the office in 2 weeks with Dr. Lequita HaltAluisio.  Arther AbbottKristie Azlaan Isidore, PA-C Orthopedic Surgery 08/03/2017, 7:46 AM

## 2017-08-03 NOTE — Evaluation (Signed)
Physical Therapy Evaluation Patient Details Name: Hunter LaityJeffrey D Swanson MRN: [id number] DOB: 06-10-1960 Today's Date: 08/03/2017   History of Present Illness  57 yo male s/p L THA-direct anterior 08/02/17. Hx of back sg, knee sg.   Clinical Impression  On eval, pt required Min assist for mobility. He walked ~100 feet with 2 crutches-pt preference (he has been on crutches for several months prior to surgery. Will follow and progress activity as tolerated. Plan is for possible d/c home later today. Per chart/pt, plan is for OP PT    Follow Up Recommendations Follow surgeon's recommendation for DC plan and follow-up therapies    Equipment Recommendations  None recommended by PT    Recommendations for Other Services       Precautions / Restrictions Precautions Precautions: Fall Restrictions Weight Bearing Restrictions: No Other Position/Activity Restrictions: WBAT      Mobility  Bed Mobility Overal bed mobility: Needs Assistance Bed Mobility: Supine to Sit     Supine to sit: Min assist;HOB elevated     General bed mobility comments: small amount of assist to move LE to EOB. Pt then was able to control putting foot on floor.   Transfers Overall transfer level: Needs assistance Equipment used: Crutches Transfers: Sit to/from Stand Sit to Stand: From elevated surface;Min assist         General transfer comment: Assist to steady. VC safety, hand/LE placement.   Ambulation/Gait Ambulation/Gait assistance: Min guard Gait Distance (Feet): 100 Feet Assistive device: Crutches Gait Pattern/deviations: Step-to pattern     General Gait Details: close guard for safety. VCs safety, sequence.   Stairs            Wheelchair Mobility    Modified Rankin (Stroke Patients Only)       Balance Overall balance assessment: Mild deficits observed, not formally tested                                           Pertinent Vitals/Pain Pain Assessment: 0-10 Pain  Score: 5  Pain Location: L thigh Pain Descriptors / Indicators: Burning;Sore;Tightness Pain Intervention(s): Monitored during session;Repositioned;Ice applied    Home Living Family/patient expects to be discharged to:: Private residence Living Arrangements: Spouse/significant other Available Help at Discharge: Family Type of Home: House Home Access: Stairs to enter Entrance Stairs-Rails: None Entrance Stairs-Number of Steps: 1 Home Layout: Laundry or work area in basement;Able to live on main level with bedroom/bathroom Home Equipment: Environmental consultantWalker - 2 wheels;Tub bench;Crutches(lift chair)      Prior Function Level of Independence: Independent               Hand Dominance        Extremity/Trunk Assessment   Upper Extremity Assessment Upper Extremity Assessment: Overall WFL for tasks assessed    Lower Extremity Assessment Lower Extremity Assessment: Generalized weakness    Cervical / Trunk Assessment Cervical / Trunk Assessment: Normal  Communication   Communication: No difficulties  Cognition Arousal/Alertness: Awake/alert Behavior During Therapy: WFL for tasks assessed/performed Overall Cognitive Status: Within Functional Limits for tasks assessed                                        General Comments      Exercises Total Joint Exercises Ankle Circles/Pumps: AROM;Both;10 reps;Supine Quad Sets: AROM;Both;10 reps;Supine Heel  Slides: AAROM;Left;10 reps;Supine Hip ABduction/ADduction: AAROM;Left;10 reps;Supine   Assessment/Plan    PT Assessment Patient needs continued PT services  PT Problem List Decreased strength;Decreased balance;Decreased mobility;Decreased range of motion;Decreased activity tolerance;Decreased knowledge of use of DME;Pain       PT Treatment Interventions DME instruction;Gait training;Functional mobility training;Balance training;Patient/family education;Therapeutic exercise;Therapeutic activities    PT Goals (Current  goals can be found in the Care Plan section)  Acute Rehab PT Goals Patient Stated Goal: home. regain PLOF PT Goal Formulation: With patient Time For Goal Achievement: 08/17/17 Potential to Achieve Goals: Good    Frequency 7X/week   Barriers to discharge        Co-evaluation               AM-PAC PT "6 Clicks" Daily Activity  Outcome Measure Difficulty turning over in bed (including adjusting bedclothes, sheets and blankets)?: A Little Difficulty moving from lying on back to sitting on the side of the bed? : Unable Difficulty sitting down on and standing up from a chair with arms (e.g., wheelchair, bedside commode, etc,.)?: Unable Help needed moving to and from a bed to chair (including a wheelchair)?: A Little Help needed walking in hospital room?: A Little Help needed climbing 3-5 steps with a railing? : A Little 6 Click Score: 14    End of Session Equipment Utilized During Treatment: Gait belt Activity Tolerance: Patient tolerated treatment well Patient left: in chair;with call bell/phone within reach   PT Visit Diagnosis: Other abnormalities of gait and mobility (R26.89)    Time: 4098-1191 PT Time Calculation (min) (ACUTE ONLY): 30 min   Charges:   PT Evaluation $PT Eval Low Complexity: 1 Low PT Treatments $Gait Training: 8-22 mins   PT G Codes:         Rebeca Alert, MPT Pager: [phone]

## 2017-08-03 NOTE — Progress Notes (Signed)
Physical Therapy Treatment Patient Details Name: XZAVIOR REINIG MRN: [id number] DOB: 18-Nov-1960 Today's Date: 08/03/2017    History of Present Illness 57 yo male s/p L THA-direct anterior 08/02/17. Hx of back sg, knee sg.     PT Comments    Progressing well. Reviewed exercises, gait training, and stair training. Issued HEP for pt to perform 2x/day until he begins OP PT. All education completed. Okay to d/c from PT standpoint-made RN aware.    Follow Up Recommendations  Follow surgeon's recommendation for DC plan and follow-up therapies     Equipment Recommendations  None recommended by PT    Recommendations for Other Services       Precautions / Restrictions Precautions Precautions: Fall Restrictions Weight Bearing Restrictions: No Other Position/Activity Restrictions: WBAT    Mobility  Bed Mobility Overal bed mobility: Needs Assistance Bed Mobility: Supine to Sit;Sit to Supine     Supine to sit: Supervision Sit to supine: Supervision      Transfers Overall transfer level: Needs assistance Equipment used: Crutches Transfers: Sit to/from Stand Sit to Stand: Supervision         General transfer comment: VC safety, hand/LE placement.   Ambulation/Gait Ambulation/Gait assistance: Min guard Gait Distance (Feet): 120 Feet Assistive device: Crutches Gait Pattern/deviations: Step-to pattern     General Gait Details: close guard for safety. VCs safety, sequence.    Stairs Stairs: Yes Stairs assistance: Min guard Stair Management: Forwards;Two rails;Step to pattern Number of Stairs: 2 General stair comments: close guard for safety. VCs safety, sequence.    Wheelchair Mobility    Modified Rankin (Stroke Patients Only)       Balance                                            Cognition Arousal/Alertness: Awake/alert Behavior During Therapy: WFL for tasks assessed/performed Overall Cognitive Status: Within Functional Limits for  tasks assessed                                        Exercises      General Comments        Pertinent Vitals/Pain Pain Assessment: 0-10 Pain Score: 5  Pain Location: L thigh Pain Descriptors / Indicators: Burning;Sore;Tightness Pain Intervention(s): Monitored during session    Home Living                      Prior Function            PT Goals (current goals can now be found in the care plan section) Progress towards PT goals: Progressing toward goals    Frequency    7X/week      PT Plan Current plan remains appropriate    Co-evaluation              AM-PAC PT "6 Clicks" Daily Activity  Outcome Measure  Difficulty turning over in bed (including adjusting bedclothes, sheets and blankets)?: A Little Difficulty moving from lying on back to sitting on the side of the bed? : A Little Difficulty sitting down on and standing up from a chair with arms (e.g., wheelchair, bedside commode, etc,.)?: A Little Help needed moving to and from a bed to chair (including a wheelchair)?: A Little Help needed walking in hospital room?: A  Little Help needed climbing 3-5 steps with a railing? : A Little 6 Click Score: 18    End of Session Equipment Utilized During Treatment: Gait belt Activity Tolerance: Patient tolerated treatment well Patient left: in bed;with call bell/phone within reach   PT Visit Diagnosis: Other abnormalities of gait and mobility (R26.89)     Time: [id number] PT Time Calculation (min) (ACUTE ONLY): 15 min  Charges:  $Gait Training: 23-37 mins                    G Codes:          Rebeca AlertJannie Louisa Favaro, MPT Pager: [phone]

## 2017-08-03 NOTE — Care Management Note (Signed)
Case Management Note  Patient Details  Name: Hunter Swanson MRN: [id number] Date of Birth: [birth year]  Subjective/Objective:   Pt admitted for OA                Action/Plan: Plan to discharge with no needs and OP PT.   Expected Discharge Date:  08/03/17               Expected Discharge Plan:  OP Rehab  In-House Referral:     Discharge planning Services  CM Consult  Post Acute Care Choice:    Choice offered to:  Patient  DME Arranged:    DME Agency:     HH Arranged:    HH Agency:     Status of Service:  Completed, signed off  If discussed at [employer] Length [employer] Stay Meetings, dates discussed:    Additional Comments:  Layani Foronda, RN 08/03/2017, 1:20 PM

## 2017-08-04 NOTE — Discharge Summary (Signed)
Physician Discharge Summary   Patient ID: Hunter Swanson MRN: [id number] DOB/AGE: 09-06-1960 57 y.o.  Admit date: 08/02/2017 Discharge date: 08/03/2017  Primary Diagnosis: Osteoarthritis left hip  Admission Diagnoses:  Past Medical History:  Diagnosis Date  . Anxiety   . Baker's cyst of knee, left    Small  . Chronic pain of left knee   . History of retinal detachment    right eye s/p repair 03-18-2016  . Hypertension   . 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 OA (osteoarthritis)    knees, hips  . Sleep apnea   . Spinal stenosis, lumbar   . Wears contact lenses    Discharge Diagnoses:   Principal Problem:   OA (osteoarthritis) of hip  Estimated body mass index is 38.12 kg/m as calculated from the following:   Height as of this encounter: '6\' 3"'  (1.905 m).   Weight as of this encounter: 138.3 kg (305 lb).  Procedure(s) (LRB): LEFT TOTAL HIP ARTHROPLASTY ANTERIOR APPROACH (Left)   Consults: None  HPI: Hunter Swanson is a 57 y.o. male who has advanced end-stage arthritis of their Left  hip with progressively worsening pain and dysfunction.The patient has failed nonoperative management and presents for  total hip arthroplasty.   Laboratory Data: Admission on 08/02/2017, Discharged on 08/03/2017  Component Date Value Ref Range Status  . WBC 08/03/2017 14.2* 4.0 - 10.5 K/uL Final  . RBC 08/03/2017 4.14* 4.22 - 5.81 MIL/uL Final  . Hemoglobin 08/03/2017 12.1* 13.0 - 17.0 g/dL Final  . HCT 08/03/2017 36.5* 39.0 - 52.0 % Final  . MCV 08/03/2017 88.2  78.0 - 100.0 fL Final  . MCH 08/03/2017 29.2  26.0 - 34.0 pg Final  . MCHC 08/03/2017 33.2  30.0 - 36.0 g/dL Final  . RDW 08/03/2017 15.6* 11.5 - 15.5 % Final  . Platelets 08/03/2017 302  150 - 400 K/uL Final   Performed at [facility], [place] [address], [place], [place] [address]  . Sodium 08/03/2017 135  135 - 145 mmol/L Final  . Potassium 08/03/2017 5.2* 3.5 - 5.1 mmol/L Final   MODERATE  HEMOLYSIS  . Chloride 08/03/2017 101  98 - 111 mmol/L Final   Please note change in reference range.  . CO2 08/03/2017 23  22 - 32 mmol/L Final  . Glucose, Bld 08/03/2017 164* 70 - 99 mg/dL Final   Please note change in reference range.  . BUN 08/03/2017 17  6 - 20 mg/dL Final   Please note change in reference range.  . Creatinine, Ser 08/03/2017 1.27* 0.61 - 1.24 mg/dL Final  . Calcium 08/03/2017 8.5* 8.9 - 10.3 mg/dL Final  . GFR calc non Af Amer 08/03/2017 >60  >60 mL/min Final  . GFR calc Af Amer 08/03/2017 >60  >60 mL/min Final   Comment: (NOTE) The eGFR has been calculated using the CKD EPI equation. This calculation has not been validated in all clinical situations. eGFR's persistently <60 mL/min signify possible Chronic Kidney Disease.   Georgiann Hahn gap 08/03/2017 11  5 - 15 Final   Performed at [facility], [place] [address] [place], [place] [address]  Hospital Outpatient Visit on 07/27/2017  Component Date Value Ref Range Status  . aPTT 07/27/2017 30  24 - 36 seconds Final   Performed at [facility], [place] [address] [place], [place] [address]  . WBC 07/27/2017 6.1  4.0 - 10.5 K/uL Final  . RBC 07/27/2017 4.98  4.22 - 5.81 MIL/uL Final  . Hemoglobin 07/27/2017 14.9  13.0 - 17.0 g/dL Final  . HCT 07/27/2017 43.8  39.0 - 52.0 % Final  . MCV 07/27/2017 88.0  78.0 - 100.0 fL Final  . MCH 07/27/2017 29.9  26.0 - 34.0 pg Final  . MCHC 07/27/2017 34.0  30.0 - 36.0 g/dL Final  . RDW 07/27/2017 15.4  11.5 - 15.5 % Final  . Platelets 07/27/2017 368  150 - 400 K/uL Final   Performed at [facility], [place] [address] [place], [place] [address]  . Sodium 07/27/2017 137  135 - 145 mmol/L Final  . Potassium 07/27/2017 4.0  3.5 - 5.1 mmol/L Final  . Chloride 07/27/2017 104  98 - 111 mmol/L Final   Please note change in reference range.  . CO2 07/27/2017 24  22 - 32 mmol/L Final  . Glucose, Bld 07/27/2017 100* 70 - 99 mg/dL Final    Please note change in reference range.  . BUN 07/27/2017 17  6 - 20 mg/dL Final   Please note change in reference range.  . Creatinine, Ser 07/27/2017 1.03  0.61 - 1.24 mg/dL Final  . Calcium 07/27/2017 9.6  8.9 - 10.3 mg/dL Final  . Total Protein 07/27/2017 7.5  6.5 - 8.1 g/dL Final  . Albumin 07/27/2017 4.1  3.5 - 5.0 g/dL Final  . AST 07/27/2017 45* 15 - 41 U/L Final  . ALT 07/27/2017 59* 0 - 44 U/L Final   Please note change in reference range.  . Alkaline Phosphatase 07/27/2017 103  38 - 126 U/L Final  . Total Bilirubin 07/27/2017 0.6  0.3 - 1.2 mg/dL Final  . GFR calc non Af Amer 07/27/2017 >60  >60 mL/min Final  . GFR calc Af Amer 07/27/2017 >60  >60 mL/min Final   Comment: (NOTE) The eGFR has been calculated using the CKD EPI equation. This calculation has not been validated in all clinical situations. eGFR's persistently <60 mL/min signify possible Chronic Kidney Disease.   Georgiann Hahn gap 07/27/2017 9  5 - 15 Final   Performed at [facility], [place] [address], [place], [place] [address]  . Prothrombin Time 07/27/2017 13.5  11.4 - 15.2 seconds Final  . INR 07/27/2017 1.04   Final   Performed at [facility], [place] [address], [place], [place] [address]  . ABO/RH(D) 07/27/2017 O POS   Final  . Antibody Screen 07/27/2017 NEG   Final  . Sample Expiration 07/27/2017 08/06/2017   Final  . Extend sample reason 07/27/2017    Final                   Value:NO TRANSFUSIONS OR PREGNANCY IN THE PAST 3 MONTHS Performed at [facility], [place] [address], [place], [place] [address]   . Color, Urine 07/27/2017 YELLOW  YELLOW Final  . APPearance 07/27/2017 CLEAR  CLEAR Final  . Specific Gravity, Urine 07/27/2017 1.018  1.005 - 1.030 Final  . pH 07/27/2017 6.0  5.0 - 8.0 Final  . Glucose, UA 07/27/2017 NEGATIVE  NEGATIVE mg/dL Final  . Hgb urine dipstick 07/27/2017 NEGATIVE  NEGATIVE Final  . Bilirubin Urine 07/27/2017 NEGATIVE  NEGATIVE  Final  . Ketones, ur 07/27/2017 NEGATIVE  NEGATIVE mg/dL Final  . Protein, ur 07/27/2017 NEGATIVE  NEGATIVE mg/dL Final  . Nitrite 07/27/2017 NEGATIVE  NEGATIVE Final  . Leukocytes, UA 07/27/2017 NEGATIVE  NEGATIVE Final   Performed at [place]  [address] [place], [place] [address]  . MRSA, PCR 07/27/2017 NEGATIVE  NEGATIVE Final  . Staphylococcus aureus 07/27/2017 NEGATIVE  NEGATIVE Final   Comment: (NOTE) The Xpert SA Assay (FDA approved for NASAL specimens in patients 48 years of age and older), is one component of a comprehensive surveillance program. It is not intended to diagnose infection nor to guide or monitor treatment. Performed at [facility], [place] [address], [place], [place] [address]   . ABO/RH(D) 07/27/2017    Final                   Value:O POS Performed at [facility], [place] [address], [place], [place] [address]      X-Rays:Dg Pelvis Portable  Result Date: 08/02/2017 CLINICAL DATA:  57 year old male status post left hip replacement. EXAM: PORTABLE PELVIS 1-2 VIEWS COMPARISON:  Intraoperative images 1657 hours today. FINDINGS: Portable AP view at 1717 hours. Bipolar type left hip arthroplasty hardware in place, appears intact and normally aligned on this single view. Postoperative drain in place. Surrounding postoperative changes to the soft tissues. No unexpected osseous changes identified. Scrotal surgical clips redemonstrated. IMPRESSION: Left hip bipolar arthroplasty with no adverse features identified. Electronically Signed   By: Genevie Ann M.D.   On: 08/02/2017 17:54   Dg C-arm 1-60 Min-no Report  Result Date: 08/02/2017 Fluoroscopy was utilized by the requesting physician.  No radiographic interpretation.    EKG: Orders placed or performed during the hospital encounter of 07/27/17  . EKG 12 lead  . EKG 12 lead     Hospital Course: Patient was admitted to [facility] and taken to the OR  and underwent the above state procedure without complications.  Patient tolerated the procedure well and was later transferred to the recovery room and then to the orthopaedic floor for postoperative care.  They were given PO and IV analgesics for pain control following their surgery.  They were given 24 hours of postoperative antibiotics of  Anti-infectives (From admission, onward)   Start     Dose/Rate Route Frequency Ordered Stop   08/02/17 2200  ceFAZolin (ANCEF) IVPB 2g/100 mL premix     2 g 200 mL/hr over 30 Minutes Intravenous Every 6 hours 08/02/17 1952 08/03/17 0515   08/02/17 0600  ceFAZolin (ANCEF) 3 g in dextrose 5 % 50 mL IVPB     3 g 100 mL/hr over 30 Minutes Intravenous On call to O.R. 08/01/17 1003 08/02/17 1611     and started on DVT prophylaxis in the form of Aspirin.   PT and OT were ordered for total hip protocol.  The patient was allowed to be WBAT with therapy. Discharge planning was consulted to help with postop disposition and equipment needs.  Patient had a good night on the evening of surgery. Patient was seen during rounds on POD #1 and he was ready to go home pending progress with therapy. Hemovac drain was pulled without difficulty. Pt completed two sessions of physical therapy on POD #1 and was meeting his goals. He was discharged to home later that day in stable condition. Pt was instructed to change dressing on the left hip on POD #2 and that he may shower on POD #3.  Diet: Regular diet Activity:WBAT Follow-up: in 2 weeks with Dr. Wynelle Link Disposition - Home with outpatient physical therapy at [facility] Discharged Condition: stable   Discharge Instructions    Call MD / Call 911   Complete by:  As directed  If you experience chest pain or shortness of breath, CALL 911 and be transported to the hospital emergency room.  If you develope a fever above 101 F, pus (white drainage) or increased drainage or redness at the wound, or calf pain, call your surgeon's  office.   Change dressing   Complete by:  As directed    You may change your dressing on Wednesday (08/04/2017), then change the dressing daily with sterile 4 x 4 inch gauze dressing and paper tape.  You may clean the incision with alcohol prior to redressing   Constipation Prevention   Complete by:  As directed    Drink plenty of fluids.  Prune juice may be helpful.  You may use a stool softener, such as Colace (over the counter) 100 mg twice a day.  Use MiraLax (over the counter) for constipation as needed.   Diet - low sodium heart healthy   Complete by:  As directed    Discharge instructions   Complete by:  As directed    Dr. Gaynelle Arabian Total Joint Specialist [facility] [address] [place] [address], [place], [place] [address] [phone]  ANTERIOR APPROACH TOTAL HIP REPLACEMENT POSTOPERATIVE DIRECTIONS   Hip Rehabilitation, Guidelines Following Surgery  The results of a hip operation are greatly improved after range of motion and muscle strengthening exercises. Follow all safety measures which are given to protect your hip. If any of these exercises cause increased pain or swelling in your joint, decrease the amount until you are comfortable again. Then slowly increase the exercises. Call your caregiver if you have problems or questions.   HOME CARE INSTRUCTIONS  Remove items at home which could result in a fall. This includes throw rugs or furniture in walking pathways.  ICE to the affected hip every three hours for 30 minutes at a time and then as needed for pain and swelling.  Continue to use ice on the hip for pain and swelling from surgery. You may notice swelling that will progress down to the foot and ankle.  This is normal after surgery.  Elevate the leg when you are not up walking on it.   Continue to use the breathing machine which will help keep your temperature down.  It is common for your temperature to cycle up and down following surgery, especially at night when you  are not up moving around and exerting yourself.  The breathing machine keeps your lungs expanded and your temperature down.  DIET You may resume your previous home diet once your are discharged from the hospital.  DRESSING / WOUND CARE / SHOWERING You may shower 3 days after surgery, but keep the wounds dry during showering.  You may use an occlusive plastic wrap (Press'n Seal for example), NO SOAKING/SUBMERGING IN THE BATHTUB.  If the bandage gets wet, change with a clean dry gauze.  If the incision gets wet, pat the wound dry with a clean towel. You may start showering once you are discharged home but do not submerge the incision under water. Just pat the incision dry and apply a dry gauze dressing on daily. Change the surgical dressing daily and reapply a dry dressing each time.  ACTIVITY Walk with your walker as instructed. Use walker as long as suggested by your caregivers. Avoid periods of inactivity such as sitting longer than an hour when not asleep. This helps prevent blood clots.  You may resume a sexual relationship in one month or when given the OK by your doctor.  You may return to work once you are cleared by your doctor.  Do not drive a car for 6 weeks or until released by you surgeon.  Do not drive while taking narcotics.  WEIGHT BEARING Weight bearing as tolerated with assist device (walker, cane, etc) as directed, use it as long as suggested by your surgeon or therapist, typically at least 4-6 weeks.  POSTOPERATIVE CONSTIPATION PROTOCOL Constipation - defined medically as fewer than three stools per week and severe constipation as less than one stool per week.  One of the most common issues patients have following surgery is constipation.  Even if you have a regular bowel pattern at home, your normal regimen is likely to be disrupted due to multiple reasons following surgery.  Combination of anesthesia, postoperative narcotics, change in appetite and fluid intake all can  affect your bowels.  In order to avoid complications following surgery, here are some recommendations in order to help you during your recovery period.  Colace (docusate) - Pick up an over-the-counter form of Colace or another stool softener and take twice a day as long as you are requiring postoperative pain medications.  Take with a full glass of water daily.  If you experience loose stools or diarrhea, hold the colace until you stool forms back up.  If your symptoms do not get better within 1 week or if they get worse, check with your doctor.  Dulcolax (bisacodyl) - Pick up over-the-counter and take as directed by the product packaging as needed to assist with the movement of your bowels.  Take with a full glass of water.  Use this product as needed if not relieved by Colace only.   MiraLax (polyethylene glycol) - Pick up over-the-counter to have on hand.  MiraLax is a solution that will increase the amount of water in your bowels to assist with bowel movements.  Take as directed and can mix with a glass of water, juice, soda, coffee, or tea.  Take if you go more than two days without a movement. Do not use MiraLax more than once per day. Call your doctor if you are still constipated or irregular after using this medication for 7 days in a row.  If you continue to have problems with postoperative constipation, please contact the office for further assistance and recommendations.  If you experience "the worst abdominal pain ever" or develop nausea or vomiting, please contact the office immediatly for further recommendations for treatment.  ITCHING  If you experience itching with your medications, try taking only a single pain pill, or even half a pain pill at a time.  You can also use Benadryl over the counter for itching or also to help with sleep.   TED HOSE STOCKINGS Wear the elastic stockings on both legs for three weeks following surgery during the day but you may remove then at night for  sleeping.  MEDICATIONS See your medication summary on the "After Visit Summary" that the nursing staff will review with you prior to discharge.  You may have some home medications which will be placed on hold until you complete the course of blood thinner medication.  It is important for you to complete the blood thinner medication as prescribed by your surgeon.  Continue your approved medications as instructed at time of discharge.  PRECAUTIONS If you experience chest pain or shortness of breath - call 911 immediately for transfer to the hospital emergency department.  If you develop a fever greater that 101 F, purulent drainage  from wound, increased redness or drainage from wound, foul odor from the wound/dressing, or calf pain - CONTACT YOUR SURGEON.                                                   FOLLOW-UP APPOINTMENTS Make sure you keep all of your appointments after your operation with your surgeon and caregivers. You should call the office at the above phone number and make an appointment for approximately two weeks after the date of your surgery or on the date instructed by your surgeon outlined in the "After Visit Summary".  RANGE OF MOTION AND STRENGTHENING EXERCISES  These exercises are designed to help you keep full movement of your hip joint. Follow your caregiver's or physical therapist's instructions. Perform all exercises about fifteen times, three times per day or as directed. Exercise both hips, even if you have had only one joint replacement. These exercises can be done on a training (exercise) mat, on the floor, on a table or on a bed. Use whatever works the best and is most comfortable for you. Use music or television while you are exercising so that the exercises are a pleasant break in your day. This will make your life better with the exercises acting as a break in routine you can look forward to.  Lying on your back, slowly slide your foot toward your buttocks, raising your  knee up off the floor. Then slowly slide your foot back down until your leg is straight again.  Lying on your back spread your legs as far apart as you can without causing discomfort.  Lying on your side, raise your upper leg and foot straight up from the floor as far as is comfortable. Slowly lower the leg and repeat.  Lying on your back, tighten up the muscle in the front of your thigh (quadriceps muscles). You can do this by keeping your leg straight and trying to raise your heel off the floor. This helps strengthen the largest muscle supporting your knee.  Lying on your back, tighten up the muscles of your buttocks both with the legs straight and with the knee bent at a comfortable angle while keeping your heel on the floor.   IF YOU ARE TRANSFERRED TO A SKILLED REHAB FACILITY If the patient is transferred to a skilled rehab facility following release from the hospital, a list of the current medications will be sent to the facility for the patient to continue.  When discharged from the skilled rehab facility, please have the facility set up the patient's [place] prior to being released. Also, the skilled facility will be responsible for providing the patient with their medications at time of release from the facility to include their pain medication, the muscle relaxants, and their blood thinner medication. If the patient is still at the rehab facility at time of the two week follow up appointment, the skilled rehab facility will also need to assist the patient in arranging follow up appointment in our office and any transportation needs.  MAKE SURE YOU:  Understand these instructions.  Get help right away if you are not doing well or get worse.    Pick up stool softner and laxative for home use following surgery while on pain medications. Do not submerge incision under water. Please use good hand washing techniques while changing  dressing each day. May shower starting three  days after surgery. Please use a clean towel to pat the incision dry following showers. Continue to use ice for pain and swelling after surgery. Do not use any lotions or creams on the incision until instructed by your surgeon.   Do not sit on low chairs, stoools or toilet seats, as it may be difficult to get up from low surfaces   Complete by:  As directed    Driving restrictions   Complete by:  As directed    No driving for two weeks   TED hose   Complete by:  As directed    Use stockings (TED hose) for three weeks on both leg(s).  You may remove them at night for sleeping.   Weight bearing as tolerated   Complete by:  As directed      Allergies as of 08/03/2017      Reactions   Neurontin [gabapentin] Rash   Penicillins Rash, Other (See Comments)   Has patient had a PCN reaction causing immediate rash, facial/tongue/throat swelling, SOB or lightheadedness with hypotension: Unknown Has patient had a PCN reaction causing severe rash involving mucus membranes or skin necrosis: Unknown Has patient had a PCN reaction that required hospitalization: Unknown Has patient had a PCN reaction occurring within the last 10 years: No If all of the above answers are "NO", then may proceed with Cephalosporin use.      Medication List    STOP taking these medications   Diclofenac Sodium CR 100 MG 24 hr tablet     TAKE these medications   amitriptyline 100 MG tablet Commonly known as:  ELAVIL Take 200 mg by mouth at bedtime.   aspirin 325 MG EC tablet Take 1 tablet (325 mg total) by mouth 2 (two) times daily for 20 days. Take one tablet (325 mg) Aspirin two times a day for three weeks following surgery. Then take one baby Aspirin (81 mg) once a day for three weeks. Then discontinue aspirin.   cephALEXin 500 MG capsule Commonly known as:  KEFLEX Take 500 mg by mouth 3 (three) times daily.   fluticasone 50 MCG/ACT nasal spray Commonly known as:  FLONASE Place 1 spray into both nostrils  daily as needed for allergies or rhinitis.   folic acid 1 MG tablet Commonly known as:  FOLVITE Take 1 mg by mouth daily.   losartan-hydrochlorothiazide 100-25 MG tablet Commonly known as:  HYZAAR Take 1 tablet by mouth daily.   methocarbamol 500 MG tablet Commonly known as:  ROBAXIN Take 1 tablet (500 mg total) by mouth every 6 (six) hours as needed for muscle spasms.   methotrexate (PF) 50 MG/2ML injection Inject 25 mg into the muscle every 'Sunday.   oxyCODONE 5 MG immediate release tablet Commonly known as:  Oxy IR/ROXICODONE Take 1-2 tablets (5-10 mg total) by mouth every 4 (four) hours as needed for moderate pain or severe pain.   venlafaxine XR 75 MG 24 hr capsule Commonly known as:  EFFEXOR-XR Take 75 mg by mouth daily with breakfast.   VITAMIN B-12 PO Take 1 tablet by mouth daily.   VITAMIN D3 PO Take 1 capsule by mouth daily.            Discharge Care Instructions  (From admission, onward)        Start     Ordered   08/03/17 0000  Weight bearing as tolerated     07' /09/19 0751   08/03/17 0000  Change dressing  Comments:  You may change your dressing on Wednesday (08/04/2017), then change the dressing daily with sterile 4 x 4 inch gauze dressing and paper tape.  You may clean the incision with alcohol prior to redressing   08/03/17 0751     Follow-up Information    Gaynelle Arabian, MD. Schedule an appointment as soon as possible for a visit on 08/17/2017.   Specialty:  Orthopedic Surgery Contact information: [address] [place] [address] [phone]           Signed: Theresa Duty, PA-C Orthopaedic Surgery 08/04/2017, 8:46 AM

## 2017-08-17 DIAGNOSIS — Z96642 Presence of left artificial hip joint: Secondary | ICD-10-CM | POA: Insufficient documentation

## 2017-11-09 ENCOUNTER — Other Ambulatory Visit (HOSPITAL_COMMUNITY): Payer: Self-pay | Admitting: Specialist

## 2017-11-09 ENCOUNTER — Ambulatory Visit (HOSPITAL_COMMUNITY)
Admission: RE | Admit: 2017-11-09 | Discharge: 2017-11-09 | Disposition: A | Discharge status: Home / Self Care | Payer: 59 | Source: Ambulatory Visit | Attending: Physician Assistant | Admitting: Physician Assistant

## 2017-11-09 DIAGNOSIS — R52 Pain, unspecified: Secondary | ICD-10-CM

## 2017-11-09 DIAGNOSIS — M7989 Other specified soft tissue disorders: Secondary | ICD-10-CM | POA: Insufficient documentation

## 2017-11-09 NOTE — Progress Notes (Signed)
Preliminary notes--Bilateral lower extremities venous duplex exam completed. Negative for deep veins thrombosis, positive for age indeterminate superficial vein thrombosis involving right small saphenous vein. Limited visibility of calf veins.  Hunter Swanson (RDMS RVT) 11/09/17 2:14 PM

## 2017-11-18 DIAGNOSIS — M179 Osteoarthritis of knee, unspecified: Secondary | ICD-10-CM | POA: Insufficient documentation

## 2017-11-18 DIAGNOSIS — M1710 Unilateral primary osteoarthritis, unspecified knee: Secondary | ICD-10-CM | POA: Insufficient documentation

## 2018-05-03 DIAGNOSIS — M25551 Pain in right hip: Secondary | ICD-10-CM | POA: Insufficient documentation

## 2018-07-06 NOTE — Progress Notes (Signed)
EKG 07-27-17 EPIC

## 2018-07-06 NOTE — Patient Instructions (Signed)
Hunter Swanson     Your procedure is scheduled on: 07-13-2018  Report to [facility] Main  Entrance  Report to admitting at 1100 AM   YOU NEED TO HAVE A COVID 19 TEST ON_______ @_______ , THIS TEST MUST BE DONE BEFORE SURGERY, [facility].    Call this number if you have problems the morning of surgery [phone]    Remember:  BRUSH YOUR TEETH MORNING OF SURGERY AND RINSE YOUR MOUTH OUT, NO CHEWING GUM CANDY OR MINTS.   NO SOLID FOOD AFTER MIDNIGHT THE NIGHT PRIOR TO SURGERY. NOTHING BY MOUTH EXCEPT CLEAR LIQUIDS UNTIL 730 AM.  PLEASE FINISH ENSURE DRINK PER SURGEON ORDER  WHICH NEEDS TO BE COMPLETED AT 430 AM.     CLEAR LIQUID DIET   Foods Allowed                                                                     Foods Excluded  Coffee and tea, regular and decaf                             liquids that you cannot  Plain Jell-O in any flavor                                             see through such as: Fruit ices (not with fruit pulp)                                     milk, soups, orange juice  Iced Popsicles                                    All solid food Carbonated beverages, regular and diet                                    Cranberry, grape and apple juices Sports drinks like Gatorade Lightly seasoned clear broth or consume(fat free) Sugar, honey syrup  Sample Menu Breakfast                                Lunch                                     Supper Cranberry juice                    Beef broth                            Chicken broth Jell-O  Grape juice                           Apple juice Coffee or tea                        Jell-O                                      Popsicle                                                Coffee or tea                        Coffee or  tea  _____________________________________________________________________     Take these medicines the morning of surgery with A SIP OF WATER: OXYCODONE IF NEEDED, VENLAFAXINE XR (EFFEXOR XR)                               You may not have any metal on your body including hair pins and              piercings  Do not wear jewelry, make-up, lotions, powders or perfumes, deodorant             Do not wear nail polish.  Do not shave  48 hours prior to surgery.              Men may shave face and neck.   Do not bring valuables to the hospital. [place].  Contacts, dentures or bridgework may not be worn into surgery.  Leave suitcase in the car. After surgery it may be brought to your room.      _____________________________________________________________________             [facility] - Preparing for Surgery Before surgery, you can play an important role.  Because skin is not sterile, your skin needs to be as free of germs as possible.  You can reduce the number of germs on your skin by washing with CHG (chlorahexidine gluconate) soap before surgery.  CHG is an antiseptic cleaner which kills germs and bonds with the skin to continue killing germs even after washing. Please DO NOT use if you have an allergy to CHG or antibacterial soaps.  If your skin becomes reddened/irritated stop using the CHG and inform your nurse when you arrive at Short Stay. Do not shave (including legs and underarms) for at least 48 hours prior to the first CHG shower.  You may shave your face/neck. Please follow these instructions carefully:  1.  Shower with CHG Soap the night before surgery and the  morning of Surgery.  2.  If you choose to wash your hair, wash your hair first as usual with your  normal  shampoo.  3.  After you shampoo, rinse your hair and body thoroughly to remove the  shampoo.  4.  Use CHG as you would any other liquid  soap.  You can apply chg directly  to the skin and wash                       Gently with a scrungie or clean washcloth.  5.  Apply the CHG Soap to your body ONLY FROM THE NECK DOWN.   Do not use on face/ open                           Wound or open sores. Avoid contact with eyes, ears mouth and genitals (private parts).                       Wash face,  Genitals (private parts) with your normal soap.             6.  Wash thoroughly, paying special attention to the area where your surgery  will be performed.  7.  Thoroughly rinse your body with warm water from the neck down.  8.  DO NOT shower/wash with your normal soap after using and rinsing off  the CHG Soap.                9.  Pat yourself dry with a clean towel.            10.  Wear clean pajamas.            11.  Place clean sheets on your bed the night of your first shower and do not  sleep with pets. Day of Surgery : Do not apply any lotions/deodorants the morning of surgery.  Please wear clean clothes to the hospital/surgery center.  FAILURE TO FOLLOW THESE INSTRUCTIONS MAY RESULT IN THE CANCELLATION OF YOUR SURGERY PATIENT SIGNATURE_________________________________  NURSE SIGNATURE__________________________________  ________________________________________________________________________   Hunter Swanson  An incentive spirometer is a tool that can help keep your lungs clear and active. This tool measures how well you are filling your lungs with each breath. Taking long deep breaths may help reverse or decrease the chance of developing breathing (pulmonary) problems (especially infection) following:  A long period of time when you are unable to move or be active. BEFORE THE PROCEDURE   If the spirometer includes an indicator to show your best effort, your nurse or respiratory therapist will set it to a desired goal.  If possible, sit up straight or lean slightly forward. Try not to slouch.  Hold the incentive spirometer  in an upright position. INSTRUCTIONS FOR USE  1. Sit on the edge of your bed if possible, or sit up as far as you can in bed or on a chair. 2. Hold the incentive spirometer in an upright position. 3. Breathe out normally. 4. Place the mouthpiece in your mouth and seal your lips tightly around it. 5. Breathe in slowly and as deeply as possible, raising the piston or the ball toward the top of the column. 6. Hold your breath for 3-5 seconds or for as long as possible. Allow the piston or ball to fall to the bottom of the column. 7. Remove the mouthpiece from your mouth and breathe out normally. 8. Rest for a few seconds and repeat Steps 1 through 7 at least 10 times every 1-2 hours when you are awake. Take your time and take a few normal breaths between deep breaths. 9. The spirometer may include an indicator to  show your best effort. Use the indicator as a goal to work toward during each repetition. 10. After each set of 10 deep breaths, practice coughing to be sure your lungs are clear. If you have an incision (the cut made at the time of surgery), support your incision when coughing by placing a pillow or rolled up towels firmly against it. Once you are able to get out of bed, walk around indoors and cough well. You may stop using the incentive spirometer when instructed by your caregiver.  RISKS AND COMPLICATIONS  Take your time so you do not get dizzy or light-headed.  If you are in pain, you may need to take or ask for pain medication before doing incentive spirometry. It is harder to take a deep breath if you are having pain. AFTER USE  Rest and breathe slowly and easily.  It can be helpful to keep track of a log of your progress. Your caregiver can provide you with a simple table to help with this. If you are using the spirometer at home, follow these instructions: [place] IF:   You are having difficultly using the spirometer.  You have trouble using the spirometer as  often as instructed.  Your pain medication is not giving enough relief while using the spirometer.  You develop fever of 100.5 F (38.1 C) or higher. SEEK IMMEDIATE MEDICAL CARE IF:   You cough up bloody sputum that had not been present before.  You develop fever of 102 F (38.9 C) or greater.  You develop worsening pain at or near the incision site. MAKE SURE YOU:   Understand these instructions.  Will watch your condition.  Will get help right away if you are not doing well or get worse. Document Released: 05/25/2006 Document Revised: 04/06/2011 Document Reviewed: 07/26/2006 [employer] Patient Information 2014 [employer], [place].   ________________________________________________________________________  WHAT IS A BLOOD TRANSFUSION? Blood Transfusion Information  A transfusion is the replacement of blood or some of its parts. Blood is made up of multiple cells which provide different functions.  Red blood cells carry oxygen and are used for blood loss replacement.  White blood cells fight against infection.  Platelets control bleeding.  Plasma helps clot blood.  Other blood products are available for specialized needs, such as hemophilia or other clotting disorders. BEFORE THE TRANSFUSION  Who gives blood for transfusions?   Healthy volunteers who are fully evaluated to make sure their blood is safe. This is blood bank blood. Transfusion therapy is the safest it has ever been in the practice of medicine. Before blood is taken from a donor, a complete history is taken to make sure that person has no history of diseases nor engages in risky social behavior (examples are intravenous drug use or sexual activity with multiple partners). The donor's travel history is screened to minimize risk of transmitting infections, such as malaria. The donated blood is tested for signs of infectious diseases, such as HIV and hepatitis. The blood is then tested to be sure it is compatible with  you in order to minimize the chance of a transfusion reaction. If you or a relative donates blood, this is often done in anticipation of surgery and is not appropriate for emergency situations. It takes many days to process the donated blood. RISKS AND COMPLICATIONS Although transfusion therapy is very safe and saves many lives, the main dangers of transfusion include:   Getting an infectious disease.  Developing a transfusion reaction. This is an allergic reaction  to something in the blood you were given. Every precaution is taken to prevent this. The decision to have a blood transfusion has been considered carefully by your caregiver before blood is given. Blood is not given unless the benefits outweigh the risks. AFTER THE TRANSFUSION  Right after receiving a blood transfusion, you will usually feel much better and more energetic. This is especially true if your red blood cells have gotten low (anemic). The transfusion raises the level of the red blood cells which carry oxygen, and this usually causes an energy increase.  The nurse administering the transfusion will monitor you carefully for complications. HOME CARE INSTRUCTIONS  No special instructions are needed after a transfusion. You may find your energy is better. Speak with your caregiver about any limitations on activity for underlying diseases you may have. SEEK MEDICAL CARE IF:   Your condition is not improving after your transfusion.  You develop redness or irritation at the intravenous (IV) site. SEEK IMMEDIATE MEDICAL CARE IF:  Any of the following symptoms occur over the next 12 hours:  Shaking chills.  You have a temperature by mouth above 102 F (38.9 C), not controlled by medicine.  Chest, back, or muscle pain.  People around you feel you are not acting correctly or are confused.  Shortness of breath or difficulty breathing.  Dizziness and fainting.  You get a rash or develop hives.  You have a decrease in  urine output.  Your urine turns a dark color or changes to pink, red, or brown. Any of the following symptoms occur over the next 10 days:  You have a temperature by mouth above 102 F (38.9 C), not controlled by medicine.  Shortness of breath.  Weakness after normal activity.  The white part of the eye turns yellow (jaundice).  You have a decrease in the amount of urine or are urinating less often.  Your urine turns a dark color or changes to pink, red, or brown. Document Released: 01/10/2000 Document Revised: 04/06/2011 Document Reviewed: 08/29/2007 [facility] Patient Information 2014 [place], [place].  _______________________________________________________________________

## 2018-07-07 ENCOUNTER — Encounter (HOSPITAL_COMMUNITY)
Admission: RE | Admit: 2018-07-07 | Discharge: 2018-07-07 | Disposition: A | Discharge status: Home / Self Care | Payer: Commercial Managed Care - PPO | Source: Ambulatory Visit | Attending: Orthopedic Surgery | Admitting: Orthopedic Surgery

## 2018-07-07 ENCOUNTER — Encounter (HOSPITAL_COMMUNITY): Payer: Self-pay

## 2018-07-07 ENCOUNTER — Other Ambulatory Visit: Payer: Self-pay

## 2018-07-07 DIAGNOSIS — M1611 Unilateral primary osteoarthritis, right hip: Secondary | ICD-10-CM | POA: Insufficient documentation

## 2018-07-07 DIAGNOSIS — Z1159 Encounter for screening for other viral diseases: Secondary | ICD-10-CM | POA: Diagnosis not present

## 2018-07-07 DIAGNOSIS — Z01812 Encounter for preprocedural laboratory examination: Secondary | ICD-10-CM | POA: Insufficient documentation

## 2018-07-07 LAB — COMPREHENSIVE METABOLIC PANEL
ALT: 42 U/L (ref 0–44)
AST: 33 U/L (ref 15–41)
Albumin: 3.9 g/dL (ref 3.5–5.0)
Alkaline Phosphatase: 87 U/L (ref 38–126)
Anion gap: 9 (ref 5–15)
BUN: 22 mg/dL — ABNORMAL HIGH (ref 6–20)
CO2: 25 mmol/L (ref 22–32)
Calcium: 9.2 mg/dL (ref 8.9–10.3)
Chloride: 101 mmol/L (ref 98–111)
Creatinine, Ser: 1.05 mg/dL (ref 0.61–1.24)
GFR calc Af Amer: >60 mL/min (ref >60)
GFR calc non Af Amer: >60 mL/min (ref >60)
Glucose, Bld: 90 mg/dL (ref 70–99)
Potassium: 3.9 mmol/L (ref 3.5–5.1)
Sodium: 135 mmol/L (ref 135–145)
Total Bilirubin: 0.4 mg/dL (ref 0.3–1.2)
Total Protein: 6.9 g/dL (ref 6.5–8.1)

## 2018-07-07 LAB — CBC
HCT: 41.4 % (ref 39.0–52.0)
Hemoglobin: 13.7 g/dL (ref 13.0–17.0)
MCH: 30.2 pg (ref 26.0–34.0)
MCHC: 33.1 g/dL (ref 30.0–36.0)
MCV: 91.2 fL (ref 80.0–100.0)
Platelets: 260 10*3/uL (ref 150–400)
RBC: 4.54 MIL/uL (ref 4.22–5.81)
RDW: 16.9 % — ABNORMAL HIGH (ref 11.5–15.5)
WBC: 5.4 10*3/uL (ref 4.0–10.5)
nRBC: 0 % (ref 0.0–0.2)

## 2018-07-07 LAB — PROTIME-INR
INR: 1 (ref 0.8–1.2)
Prothrombin Time: 13.4 seconds (ref 11.4–15.2)

## 2018-07-07 LAB — SURGICAL PCR SCREEN
MRSA, PCR: NEGATIVE
Staphylococcus aureus: NEGATIVE

## 2018-07-07 LAB — APTT: aPTT: 30 seconds (ref 24–36)

## 2018-07-07 NOTE — Patient Instructions (Addendum)
Hunter LaityJeffrey D Swanson  07/07/2018   Your procedure is scheduled on: Wednesday 07/13/2018  [facility]                Report to admitting at   1100 AM               YOU NEED TO HAVE A COVID 19 TEST ON_06/13/2020 @_0930  am_, THIS TEST MUST BE DONE BEFORE SURGERY, [facility].    Call this number if you have problems the morning of surgery [phone]               Please bring CPAP mask and tubing  The day of surgery!   Remember: Do not eat food  :After Midnight.                NO SOLID FOOD AFTER MIDNIGHT THE NIGHT PRIOR TO SURGERY. NOTHING BY MOUTH EXCEPT CLEAR LIQUIDS UNTIL  0730 am then nothing until after surgery!              PLEASE FINISH ENSURE DRINK PER SURGEON ORDER  WHICH NEEDS TO BE COMPLETED AT  0430 am   CLEAR LIQUID DIET   Foods Allowed                                                                     Foods Excluded  Coffee and tea, regular and decaf                             liquids that you cannot  Plain Jell-O in any flavor                                             see through such as: Fruit ices (not with fruit pulp)                                     milk, soups, orange juice  Iced Popsicles                                    All solid food Carbonated beverages, regular and diet                                    Cranberry, grape and apple juices Sports drinks like Gatorade Lightly seasoned clear broth or consume(fat free) Sugar, honey syrup  Sample Menu Breakfast                                Lunch  Supper Cranberry juice                    Beef broth                            Chicken broth Jell-O                                     Grape juice                           Apple juice Coffee or tea                        Jell-O                                      Popsicle                                                Coffee  or tea                        Coffee or tea  _____________________________________________________________________               BRUSH YOUR TEETH MORNING OF SURGERY AND RINSE YOUR MOUTH OUT, NO CHEWING GUM CANDY OR MINTS.     Take these medicines the morning of surgery with A SIP OF WATER: Venlafaxine , Oxycodone if needed                                  You may not have any metal on your body including hair pins and              piercings  Do not wear jewelry, make-up, lotions, powders or perfumes, deodorant                         Men may shave face and neck.   Do not bring valuables to the [facility]. [facility] IS NOT             RESPONSIBLE   FOR VALUABLES.  Contacts, dentures or bridgework may not be worn into surgery.  Leave suitcase in the car. After surgery it may be brought to your room.                 Please read over the following fact sheets you were given: _____________________________________________________________________             [facility] - Preparing for Surgery Before surgery, you can play an important role.  Because skin is not sterile, your skin needs to be as free of germs as possible.  You can reduce the number of germs on your skin by washing with CHG (chlorahexidine gluconate) soap before surgery.  CHG is an antiseptic cleaner which kills germs and bonds with the skin to continue killing germs even after washing. Please DO NOT use if you have an allergy to CHG or antibacterial soaps.  If  your skin becomes reddened/irritated stop using the CHG and inform your nurse when you arrive at Short Stay. Do not shave (including legs and underarms) for at least 48 hours prior to the first CHG shower.  You may shave your face/neck. Please follow these instructions carefully:  1.  Shower with CHG Soap the night before surgery and the  morning of Surgery.  2.  If you choose to wash your hair, wash your hair first as usual with your  normal  shampoo.  3.  After  you shampoo, rinse your hair and body thoroughly to remove the  shampoo.                           4.  Use CHG as you would any other liquid soap.  You can apply chg directly  to the skin and wash                       Gently with a scrungie or clean washcloth.  5.  Apply the CHG Soap to your body ONLY FROM THE NECK DOWN.   Do not use on face/ open                           Wound or open sores. Avoid contact with eyes, ears mouth and genitals (private parts).                       Wash face,  Genitals (private parts) with your normal soap.             6.  Wash thoroughly, paying special attention to the area where your surgery  will be performed.  7.  Thoroughly rinse your body with warm water from the neck down.  8.  DO NOT shower/wash with your normal soap after using and rinsing off  the CHG Soap.                9.  Pat yourself dry with a clean towel.            10.  Wear clean pajamas.            11.  Place clean sheets on your bed the night of your first shower and do not  sleep with pets. Day of Surgery : Do not apply any lotions/deodorants the morning of surgery.  Please wear clean clothes to the [facility].  FAILURE TO FOLLOW THESE INSTRUCTIONS MAY RESULT IN THE CANCELLATION OF YOUR SURGERY PATIENT SIGNATURE_________________________________  NURSE SIGNATURE__________________________________  ________________________________________________________________________   [facility]  An incentive Swanson is a tool that can help keep your lungs clear and active. This tool measures how well you are filling your lungs with each breath. Taking long deep breaths may help reverse or decrease the chance of developing breathing (pulmonary) problems (especially infection) following:  A long period of time when you are unable to move or be active. BEFORE THE PROCEDURE   If the Swanson includes an indicator to show your best effort, your nurse or respiratory therapist  will set it to a desired goal.  If possible, sit up straight or lean slightly forward. Try not to slouch.  Hold the incentive Swanson in an upright position. INSTRUCTIONS FOR USE  1. Sit on the edge of your bed if possible, or sit up as far as you can in bed or on a  chair. 2. Hold the incentive Swanson in an upright position. 3. Breathe out normally. 4. Place the mouthpiece in your mouth and seal your lips tightly around it. 5. Breathe in slowly and as deeply as possible, raising the piston or the ball toward the top of the column. 6. Hold your breath for 3-5 seconds or for as long as possible. Allow the piston or ball to fall to the bottom of the column. 7. Remove the mouthpiece from your mouth and breathe out normally. 8. Rest for a few seconds and repeat Steps 1 through 7 at least 10 times every 1-2 hours when you are awake. Take your time and take a few normal breaths between deep breaths. 9. The Swanson may include an indicator to show your best effort. Use the indicator as a goal to work toward during each repetition. 10. After each set of 10 deep breaths, practice coughing to be sure your lungs are clear. If you have an incision (the cut made at the time of surgery), support your incision when coughing by placing a pillow or rolled up towels firmly against it. Once you are able to get out of bed, walk around indoors and cough well. You may stop using the incentive Swanson when instructed by your caregiver.  RISKS AND COMPLICATIONS  Take your time so you do not get dizzy or light-headed.  If you are in pain, you may need to take or ask for pain medication before doing incentive spirometry. It is harder to take a deep breath if you are having pain. AFTER USE  Rest and breathe slowly and easily.  It can be helpful to keep track of a log of your progress. Your caregiver can provide you with a simple table to help with this. If you are using the Swanson at home, follow  these instructions: [place] IF:   You are having difficultly using the Swanson.  You have trouble using the Swanson as often as instructed.  Your pain medication is not giving enough relief while using the Swanson.  You develop fever of 100.5 F (38.1 C) or higher. SEEK IMMEDIATE MEDICAL CARE IF:   You cough up bloody sputum that had not been present before.  You develop fever of 102 F (38.9 C) or greater.  You develop worsening pain at or near the incision site. MAKE SURE YOU:   Understand these instructions.  Will watch your condition.  Will get help right away if you are not doing well or get worse. Document Released: 05/25/2006 Document Revised: 04/06/2011 Document Reviewed: 07/26/2006 ExitCare Patient Information 2014 ExitCare, [place].   ________________________________________________________________________  WHAT IS A BLOOD TRANSFUSION? Blood Transfusion Information  A transfusion is the replacement of blood or some of its parts. Blood is made up of multiple cells which provide different functions.  Red blood cells carry oxygen and are used for blood loss replacement.  White blood cells fight against infection.  Platelets control bleeding.  Plasma helps clot blood.  Other blood products are available for specialized needs, such as hemophilia or other clotting disorders. BEFORE THE TRANSFUSION  Who gives blood for transfusions?   Healthy volunteers who are fully evaluated to make sure their blood is safe. This is blood bank blood. Transfusion therapy is the safest it has ever been in the practice of medicine. Before blood is taken from a donor, a complete history is taken to make sure that person has no history of diseases nor engages in risky social behavior (examples are  intravenous drug use or sexual activity with multiple partners). The donor's travel history is screened to minimize risk of transmitting infections, such as malaria. The  donated blood is tested for signs of infectious diseases, such as HIV and hepatitis. The blood is then tested to be sure it is compatible with you in order to minimize the chance of a transfusion reaction. If you or a relative donates blood, this is often done in anticipation of surgery and is not appropriate for emergency situations. It takes many days to process the donated blood. RISKS AND COMPLICATIONS Although transfusion therapy is very safe and saves many lives, the main dangers of transfusion include:   Getting an infectious disease.  Developing a transfusion reaction. This is an allergic reaction to something in the blood you were given. Every precaution is taken to prevent this. The decision to have a blood transfusion has been considered carefully by your caregiver before blood is given. Blood is not given unless the benefits outweigh the risks. AFTER THE TRANSFUSION  Right after receiving a blood transfusion, you will usually feel much better and more energetic. This is especially true if your red blood cells have gotten low (anemic). The transfusion raises the level of the red blood cells which carry oxygen, and this usually causes an energy increase.  The nurse administering the transfusion will monitor you carefully for complications. HOME CARE INSTRUCTIONS  No special instructions are needed after a transfusion. You may find your energy is better. Speak with your caregiver about any limitations on activity for underlying diseases you may have. SEEK MEDICAL CARE IF:   Your condition is not improving after your transfusion.  You develop redness or irritation at the intravenous (IV) site. SEEK IMMEDIATE MEDICAL CARE IF:  Any of the following symptoms occur over the next 12 hours:  Shaking chills.  You have a temperature by mouth above 102 F (38.9 C), not controlled by medicine.  Chest, back, or muscle pain.  People around you feel you are not acting correctly or are  confused.  Shortness of breath or difficulty breathing.  Dizziness and fainting.  You get a rash or develop hives.  You have a decrease in urine output.  Your urine turns a dark color or changes to pink, red, or brown. Any of the following symptoms occur over the next 10 days:  You have a temperature by mouth above 102 F (38.9 C), not controlled by medicine.  Shortness of breath.  Weakness after normal activity.  The white part of the eye turns yellow (jaundice).  You have a decrease in the amount of urine or are urinating less often.  Your urine turns a dark color or changes to pink, red, or brown. Document Released: 01/10/2000 Document Revised: 04/06/2011 Document Reviewed: 08/29/2007 [facility] Patient Information 2014 [place], [place].  _______________________________________________________________________

## 2018-07-09 ENCOUNTER — Other Ambulatory Visit (HOSPITAL_COMMUNITY)
Admission: RE | Admit: 2018-07-09 | Discharge: 2018-07-09 | Disposition: A | Discharge status: Home / Self Care | Payer: Commercial Managed Care - PPO | Source: Ambulatory Visit | Attending: Orthopedic Surgery | Admitting: Orthopedic Surgery

## 2018-07-09 DIAGNOSIS — Z01812 Encounter for preprocedural laboratory examination: Secondary | ICD-10-CM | POA: Diagnosis not present

## 2018-07-11 LAB — NOVEL CORONAVIRUS, NAA (HOSP ORDER, SEND-OUT TO REF LAB; TAT 18-24 HRS): SARS-CoV-2, NAA: NOT DETECTED

## 2018-07-11 NOTE — H&P (Signed)
TOTAL HIP ADMISSION H&P  Patient is admitted for right total hip arthroplasty.  Subjective:  Chief Complaint: right hip pain  HPI: Hunter Swanson, 58 y.o. male, has a history of pain and functional disability in the right hip(s) due to arthritis and patient has failed non-surgical conservative treatments for greater than 12 weeks to include NSAID's and/or analgesics, corticosteriod injections, viscosupplementation injections, flexibility and strengthening excercises, use of assistive devices, weight reduction as appropriate and activity modification.  Onset of symptoms was gradual starting 4 years ago with gradually worsening course since that time.The patient noted no past surgery on the right hip(s).  Patient currently rates pain in the right hip at 8 out of 10 with activity. Patient has night pain, worsening of pain with activity and weight bearing, pain that interfers with activities of daily living and pain with passive range of motion. Patient has evidence of subchondral cysts, periarticular osteophytes and joint space narrowing by imaging studies. This condition presents safety issues increasing the risk of falls.  There is no current active infection.  Patient Active Problem List   Diagnosis Date Noted  . OA (osteoarthritis) of hip 08/02/2017   Past Medical History:  Diagnosis Date  . Anxiety   . Baker's cyst of knee, left    Small  . Chronic pain of left knee   . History of retinal detachment    right eye s/p repair 03-18-2016  . Hypertension   . 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 OA (osteoarthritis)    knees, hips  . Sleep apnea    use CPAP  . Spinal stenosis, lumbar   . Wears contact lenses     Past Surgical History:  Procedure Laterality Date  . CERVICAL SPINE SURGERY  1997   bone spur removal due to L UE radicular symptoms  . INCISION AND DRAINAGE COMPLEX POSTOPERATIVE WOUND INFECTION, BACK  05-13-2017  @ [facility]  . KNEE ARTHROSCOPY Left x6 last one 12/  2018  . LUMBAR FUSION  2008   L3-4  . POSTERIOR LUMBAR FUSION  04-12-2017   @ [facility]   L2-3, L5-S1 OLIF and posterior decompression; revision L2-S1 PSIF  . SHOULDER ARTHROSCOPY W/ SUBACROMIAL DECOMPRESSION AND DISTAL CLAVICLE EXCISION Left 08/02/14  . TOTAL HIP ARTHROPLASTY Left 08/02/2017   Procedure: LEFT TOTAL HIP ARTHROPLASTY ANTERIOR APPROACH;  Surgeon: Ollen GrossAluisio, Frank, MD;  Location: [facility];  Service: Orthopedics;  Laterality: Left;  Marland Kitchen. VITRECTOMY Right 03/18/2016    No current facility-administered medications for this encounter.    Current Outpatient Medications  Medication Sig Dispense Refill Last Dose  . amitriptyline (ELAVIL) 100 MG tablet Take 200 mg by mouth at bedtime.     . Cholecalciferol (VITAMIN D3) 25 MCG (1000 UT) CAPS Take 1,000 capsules by mouth daily.      . Diclofenac Sodium CR 100 MG 24 hr tablet Take 100 mg by mouth daily.     . fluticasone (FLONASE) 50 MCG/ACT nasal spray Place 1 spray into both nostrils daily as needed for allergies or rhinitis.     . folic acid (FOLVITE) 1 MG tablet Take 1 mg by mouth daily.  0   . LORazepam (ATIVAN) 2 MG tablet Take 2 mg by mouth at bedtime.     Marland Kitchen. losartan-hydrochlorothiazide (HYZAAR) 100-25 MG tablet Take 1 tablet by mouth daily.     . methocarbamol (ROBAXIN) 500 MG tablet Take 1 tablet (500 mg total) by mouth every 6 (six) hours as needed for muscle spasms.  40 tablet 0   . Methotrexate Sodium (METHOTREXATE, PF,) 50 MG/2ML injection Inject 25 mg into the muscle every Sunday.  4   . oxyCODONE (OXY IR/ROXICODONE) 5 MG immediate release tablet Take 1-2 tablets (5-10 mg total) by mouth every 4 (four) hours as needed for moderate pain or severe pain. 56 tablet 0   . venlafaxine XR (EFFEXOR-XR) 75 MG 24 hr capsule Take 75 mg by mouth daily with breakfast.      Allergies  Allergen Reactions  . Neurontin [Gabapentin] Rash  . Penicillins Rash and Other (See Comments)    Has patient had a PCN reaction causing immediate rash,  facial/tongue/throat swelling, SOB or lightheadedness with hypotension: Unknown Has patient had a PCN reaction causing severe rash involving mucus membranes or skin necrosis: Unknown Has patient had a PCN reaction that required hospitalization: Unknown Has patient had a PCN reaction occurring within the last 10 years: No If all of the above answers are "NO", then may proceed with Cephalosporin use.     Social History   Tobacco Use  . Smoking status: Never Smoker  . Smokeless tobacco: Never Used  Substance Use Topics  . Alcohol use: Yes    Comment: rare      Review of Systems  Constitutional: Negative.   HENT: Negative.   Eyes: Negative.   Respiratory: Negative.   Cardiovascular: Negative.   Gastrointestinal: Negative.   Genitourinary: Negative.   Musculoskeletal: Positive for joint pain and myalgias. Negative for back pain, falls and neck pain.  Skin: Negative.   Neurological: Negative.   Endo/Heme/Allergies: Negative.   Psychiatric/Behavioral: Negative.     Objective:  Physical Exam  Constitutional: He is oriented to person, place, and time. He appears well-developed. No distress.  Morbidly obese  HENT:  Head: Normocephalic and atraumatic.  Right Ear: External ear normal.  Left Ear: External ear normal.  Nose: Nose normal.  Mouth/Throat: Oropharynx is clear and moist.  Eyes: Conjunctivae and EOM are normal.  Neck: Normal range of motion. Neck supple.  Cardiovascular: Normal rate, regular rhythm, normal heart sounds and intact distal pulses.  No murmur heard. Respiratory: Effort normal and breath sounds normal. No respiratory distress. He has no wheezes.  GI: Soft. Bowel sounds are normal. He exhibits no distension. There is no abdominal tenderness.  Musculoskeletal:     Left hip: Normal.     Comments: Significant antalgic gait without the use of assisted devices.  Right Hip Exam: ROM: Flexion to 90, Internal Rotation 0, External Rotation 0, and abduction 20  with discomfort. Positive tenderness over the greater trochanter bursa.  Left hip incision healed with no signs of infection.  Neurological: He is alert and oriented to person, place, and time. He has normal strength. No sensory deficit.  Skin: No rash noted. He is not diaphoretic. No erythema.  Psychiatric: He has a normal mood and affect. His behavior is normal.   Vitals Ht: 6 ft 3 in Wt: 288 lbs  BMI: 36  BP: 122/86 sitting L arm  Pulse: 80 bpm    Imaging Review Plain radiographs demonstrate severe degenerative joint disease of the right hip(s). The bone quality appears to be good for age and reported activity level.    Assessment/Plan:  End stage primary osteoarthritis, right hip(s)  The patient history, physical examination, clinical judgement of the provider and imaging studies are consistent with end stage degenerative joint disease of the right hip(s) and total hip arthroplasty is deemed medically necessary. The treatment options including medical management,  injection therapy, arthroscopy and arthroplasty were discussed at length. The risks and benefits of total hip arthroplasty were presented and reviewed. The risks due to aseptic loosening, infection, stiffness, dislocation/subluxation,  thromboembolic complications and other imponderables were discussed.  The patient acknowledged the explanation, agreed to proceed with the plan and consent was signed. Patient is being admitted for inpatient treatment for surgery, pain control, PT, OT, prophylactic antibiotics, VTE prophylaxis, progressive ambulation and ADL's and discharge planning.The patient is planning to be discharged home with HEP.    Therapy Plans: HEP Disposition: Home with daughter Planned DVT prophylaxis: Xarelto 10mg  daily DME needed: has equipment PCP: Dr. Sinclair Ship, [facility] in [place], [place] Other: no anesthesia concerns TXA IV  Discussed medications to stop 5 days prior to surgery   Ardeen Jourdain,  PA-C

## 2018-07-12 MED ORDER — DEXTROSE 5 % IV SOLN
3.0000 g | INTRAVENOUS | Status: AC
  Administered 2018-07-13: 3 g via INTRAVENOUS
  Filled 2018-07-12: qty 3

## 2018-07-12 NOTE — Progress Notes (Signed)
SPOKE W/   Voice mail left requesting pt call if he answered yes to any of the following questions.      SCREENING SYMPTOMS OF COVID 19:   COUGH-- no  RUNNY NOSE--- no  SORE THROAT--- no  NASAL CONGESTION---- no  SNEEZING---- no  SHORTNESS OF BREATH--- no  DIFFICULTY BREATHING--- no  TEMP >100.0 ----- no  UNEXPLAINED BODY ACHES------ no  CHILLS -------- no  HEADACHES --------- no  LOSS OF SMELL/ TASTE -------- no    HAVE YOU OR ANY FAMILY MEMBER TRAVELLED PAST 14 DAYS OUT OF THE   COUNTY--- no STATE---- no COUNTRY---- no  HAVE YOU OR ANY FAMILY MEMBER BEEN EXPOSED TO ANYONE WITH COVID 19?  no

## 2018-07-13 ENCOUNTER — Inpatient Hospital Stay (HOSPITAL_COMMUNITY)
Admission: RE | Admit: 2018-07-13 | Discharge: 2018-07-14 | DRG: 470 | Disposition: A | Discharge status: Home / Self Care | Payer: Commercial Managed Care - PPO | Attending: Orthopedic Surgery | Admitting: Orthopedic Surgery

## 2018-07-13 ENCOUNTER — Inpatient Hospital Stay (HOSPITAL_COMMUNITY): Payer: Commercial Managed Care - PPO

## 2018-07-13 ENCOUNTER — Inpatient Hospital Stay (HOSPITAL_COMMUNITY): Payer: Commercial Managed Care - PPO | Admitting: Certified Registered"

## 2018-07-13 ENCOUNTER — Inpatient Hospital Stay (HOSPITAL_COMMUNITY): Payer: Commercial Managed Care - PPO | Admitting: Physician Assistant

## 2018-07-13 ENCOUNTER — Encounter (HOSPITAL_COMMUNITY): Payer: Self-pay

## 2018-07-13 ENCOUNTER — Other Ambulatory Visit: Payer: Self-pay

## 2018-07-13 ENCOUNTER — Encounter (HOSPITAL_COMMUNITY)
Admission: RE | Disposition: A | Discharge status: Home / Self Care | Payer: Self-pay | Source: Home / Self Care | Attending: Orthopedic Surgery

## 2018-07-13 DIAGNOSIS — M1611 Unilateral primary osteoarthritis, right hip: Secondary | ICD-10-CM | POA: Diagnosis present

## 2018-07-13 DIAGNOSIS — Z981 Arthrodesis status: Secondary | ICD-10-CM | POA: Diagnosis not present

## 2018-07-13 DIAGNOSIS — Z419 Encounter for procedure for purposes other than remedying health state, unspecified: Secondary | ICD-10-CM

## 2018-07-13 DIAGNOSIS — Z79899 Other long term (current) drug therapy: Secondary | ICD-10-CM

## 2018-07-13 DIAGNOSIS — G4730 Sleep apnea, unspecified: Secondary | ICD-10-CM | POA: Diagnosis present

## 2018-07-13 DIAGNOSIS — Z1159 Encounter for screening for other viral diseases: Secondary | ICD-10-CM

## 2018-07-13 DIAGNOSIS — Z96649 Presence of unspecified artificial hip joint: Secondary | ICD-10-CM

## 2018-07-13 DIAGNOSIS — Z96642 Presence of left artificial hip joint: Secondary | ICD-10-CM | POA: Diagnosis present

## 2018-07-13 DIAGNOSIS — I10 Essential (primary) hypertension: Secondary | ICD-10-CM | POA: Diagnosis present

## 2018-07-13 DIAGNOSIS — M169 Osteoarthritis of hip, unspecified: Secondary | ICD-10-CM | POA: Diagnosis present

## 2018-07-13 DIAGNOSIS — Z6836 Body mass index (BMI) 36.0-36.9, adult: Secondary | ICD-10-CM

## 2018-07-13 HISTORY — PX: TOTAL HIP ARTHROPLASTY: SHX124

## 2018-07-13 LAB — TYPE AND SCREEN
ABO/RH(D): O POS
Antibody Screen: NEGATIVE

## 2018-07-13 SURGERY — ARTHROPLASTY, HIP, TOTAL, ANTERIOR APPROACH
Anesthesia: General | Site: Hip | Laterality: Right

## 2018-07-13 MED ORDER — MENTHOL 3 MG MT LOZG: 1.0000 | LOZENGE | OROMUCOSAL | Status: DC | PRN

## 2018-07-13 MED ORDER — BISACODYL 10 MG RE SUPP: 10.0000 mg | Freq: Every day | RECTAL | Status: DC | PRN

## 2018-07-13 MED ORDER — KETAMINE HCL 10 MG/ML IJ SOLN
INTRAMUSCULAR | Status: DC | PRN
  Administered 2018-07-13: 30 mg via INTRAVENOUS
  Administered 2018-07-13: 50 mg via INTRAVENOUS

## 2018-07-13 MED ORDER — HYDROMORPHONE HCL 1 MG/ML IJ SOLN
INTRAMUSCULAR | Status: AC
  Filled 2018-07-13: qty 1

## 2018-07-13 MED ORDER — DEXAMETHASONE SODIUM PHOSPHATE 10 MG/ML IJ SOLN
INTRAMUSCULAR | Status: AC
  Filled 2018-07-13: qty 1

## 2018-07-13 MED ORDER — SUGAMMADEX SODIUM 500 MG/5ML IV SOLN
INTRAVENOUS | Status: AC
  Filled 2018-07-13: qty 5

## 2018-07-13 MED ORDER — ACETAMINOPHEN 500 MG PO TABS: 1000.0000 mg | ORAL_TABLET | Freq: Once | ORAL | Status: DC

## 2018-07-13 MED ORDER — ONDANSETRON HCL 4 MG PO TABS: 4.0000 mg | ORAL_TABLET | Freq: Four times a day (QID) | ORAL | Status: DC | PRN

## 2018-07-13 MED ORDER — LIDOCAINE 2% (20 MG/ML) 5 ML SYRINGE
INTRAMUSCULAR | Status: DC | PRN
  Administered 2018-07-13: 100 mg via INTRAVENOUS

## 2018-07-13 MED ORDER — FENTANYL CITRATE (PF) 100 MCG/2ML IJ SOLN
INTRAMUSCULAR | Status: AC
  Filled 2018-07-13: qty 2

## 2018-07-13 MED ORDER — ACETAMINOPHEN 500 MG PO TABS
1000.0000 mg | ORAL_TABLET | Freq: Four times a day (QID) | ORAL | Status: DC
  Administered 2018-07-13 – 2018-07-14 (×3): 1000 mg via ORAL
  Filled 2018-07-13 (×4): qty 2

## 2018-07-13 MED ORDER — DOCUSATE SODIUM 100 MG PO CAPS
100.0000 mg | ORAL_CAPSULE | Freq: Two times a day (BID) | ORAL | Status: DC
  Administered 2018-07-13 – 2018-07-14 (×2): 100 mg via ORAL
  Filled 2018-07-13 (×2): qty 1

## 2018-07-13 MED ORDER — PROPOFOL 10 MG/ML IV BOLUS
INTRAVENOUS | Status: AC
  Filled 2018-07-13: qty 20

## 2018-07-13 MED ORDER — METOCLOPRAMIDE HCL 5 MG/ML IJ SOLN: 5.0000 mg | Freq: Three times a day (TID) | INTRAMUSCULAR | Status: DC | PRN

## 2018-07-13 MED ORDER — LOSARTAN POTASSIUM 50 MG PO TABS
100.0000 mg | ORAL_TABLET | Freq: Every day | ORAL | Status: DC
  Administered 2018-07-14: 100 mg via ORAL
  Filled 2018-07-13: qty 2

## 2018-07-13 MED ORDER — PHENOL 1.4 % MT LIQD: 1.0000 | OROMUCOSAL | Status: DC | PRN

## 2018-07-13 MED ORDER — DEXAMETHASONE SODIUM PHOSPHATE 10 MG/ML IJ SOLN: 8.0000 mg | Freq: Once | INTRAMUSCULAR | Status: DC

## 2018-07-13 MED ORDER — RIVAROXABAN 10 MG PO TABS
10.0000 mg | ORAL_TABLET | Freq: Every day | ORAL | Status: DC
  Administered 2018-07-14: 10 mg via ORAL
  Filled 2018-07-13: qty 1

## 2018-07-13 MED ORDER — KETAMINE HCL 10 MG/ML IJ SOLN
INTRAMUSCULAR | Status: AC
  Filled 2018-07-13: qty 1

## 2018-07-13 MED ORDER — HYDROMORPHONE HCL 1 MG/ML IJ SOLN
0.5000 mg | INTRAMUSCULAR | Status: AC | PRN
  Administered 2018-07-13 (×2): 0.5 mg via INTRAVENOUS

## 2018-07-13 MED ORDER — ROCURONIUM BROMIDE 10 MG/ML (PF) SYRINGE
PREFILLED_SYRINGE | INTRAVENOUS | Status: DC | PRN
  Administered 2018-07-13: 10 mg via INTRAVENOUS
  Administered 2018-07-13: 50 mg via INTRAVENOUS
  Administered 2018-07-13: 20 mg via INTRAVENOUS

## 2018-07-13 MED ORDER — LOSARTAN POTASSIUM-HCTZ 100-25 MG PO TABS: 1.0000 | ORAL_TABLET | Freq: Every day | ORAL | Status: DC

## 2018-07-13 MED ORDER — EPHEDRINE SULFATE-NACL 50-0.9 MG/10ML-% IV SOSY
PREFILLED_SYRINGE | INTRAVENOUS | Status: DC | PRN
  Administered 2018-07-13 (×4): 10 mg via INTRAVENOUS

## 2018-07-13 MED ORDER — DEXAMETHASONE SODIUM PHOSPHATE 10 MG/ML IJ SOLN
10.0000 mg | Freq: Once | INTRAMUSCULAR | Status: AC
  Administered 2018-07-14: 10 mg via INTRAVENOUS
  Filled 2018-07-13: qty 1

## 2018-07-13 MED ORDER — ONDANSETRON HCL 4 MG/2ML IJ SOLN
INTRAMUSCULAR | Status: AC
  Filled 2018-07-13: qty 2

## 2018-07-13 MED ORDER — MIDAZOLAM HCL 2 MG/2ML IJ SOLN
INTRAMUSCULAR | Status: DC | PRN
  Administered 2018-07-13: 2 mg via INTRAVENOUS

## 2018-07-13 MED ORDER — PROPOFOL 10 MG/ML IV BOLUS
INTRAVENOUS | Status: AC
  Filled 2018-07-13: qty 80

## 2018-07-13 MED ORDER — LORAZEPAM 1 MG PO TABS
2.0000 mg | ORAL_TABLET | Freq: Every day | ORAL | Status: DC
  Administered 2018-07-13: 2 mg via ORAL
  Filled 2018-07-13 (×2): qty 2

## 2018-07-13 MED ORDER — SUCCINYLCHOLINE CHLORIDE 200 MG/10ML IV SOSY
PREFILLED_SYRINGE | INTRAVENOUS | Status: AC
  Filled 2018-07-13: qty 10

## 2018-07-13 MED ORDER — ROCURONIUM BROMIDE 10 MG/ML (PF) SYRINGE
PREFILLED_SYRINGE | INTRAVENOUS | Status: AC
  Filled 2018-07-13: qty 10

## 2018-07-13 MED ORDER — OXYCODONE HCL 5 MG PO TABS
5.0000 mg | ORAL_TABLET | ORAL | Status: DC | PRN
  Administered 2018-07-13 – 2018-07-14 (×3): 10 mg via ORAL
  Filled 2018-07-13 (×2): qty 2

## 2018-07-13 MED ORDER — BUPIVACAINE-EPINEPHRINE (PF) 0.25% -1:200000 IJ SOLN
INTRAMUSCULAR | Status: AC
  Filled 2018-07-13: qty 30

## 2018-07-13 MED ORDER — HYDROMORPHONE HCL 1 MG/ML IJ SOLN
0.2500 mg | INTRAMUSCULAR | Status: DC | PRN
  Administered 2018-07-13 (×4): 0.5 mg via INTRAVENOUS

## 2018-07-13 MED ORDER — MIDAZOLAM HCL 2 MG/2ML IJ SOLN
INTRAMUSCULAR | Status: AC
  Filled 2018-07-13: qty 2

## 2018-07-13 MED ORDER — VENLAFAXINE HCL ER 75 MG PO CP24
75.0000 mg | ORAL_CAPSULE | Freq: Every day | ORAL | Status: DC
  Administered 2018-07-14: 75 mg via ORAL
  Filled 2018-07-13: qty 1

## 2018-07-13 MED ORDER — TRANEXAMIC ACID-NACL 1000-0.7 MG/100ML-% IV SOLN
1000.0000 mg | INTRAVENOUS | Status: AC
  Administered 2018-07-13: 1000 mg via INTRAVENOUS
  Filled 2018-07-13: qty 100

## 2018-07-13 MED ORDER — ACETAMINOPHEN 10 MG/ML IV SOLN
1000.0000 mg | Freq: Four times a day (QID) | INTRAVENOUS | Status: DC
  Administered 2018-07-13: 1000 mg via INTRAVENOUS
  Filled 2018-07-13: qty 100

## 2018-07-13 MED ORDER — BUPIVACAINE-EPINEPHRINE (PF) 0.25% -1:200000 IJ SOLN
INTRAMUSCULAR | Status: DC | PRN
  Administered 2018-07-13: 30 mL

## 2018-07-13 MED ORDER — LACTATED RINGERS IV SOLN
INTRAVENOUS | Status: DC
  Administered 2018-07-13 (×2): via INTRAVENOUS

## 2018-07-13 MED ORDER — POVIDONE-IODINE 10 % EX SWAB
2.0000 "application " | Freq: Once | CUTANEOUS | Status: AC
  Administered 2018-07-13: 2 via TOPICAL

## 2018-07-13 MED ORDER — CEFAZOLIN SODIUM-DEXTROSE 2-4 GM/100ML-% IV SOLN
2.0000 g | Freq: Four times a day (QID) | INTRAVENOUS | Status: AC
  Administered 2018-07-13 (×2): 2 g via INTRAVENOUS
  Filled 2018-07-13 (×2): qty 100

## 2018-07-13 MED ORDER — DIPHENHYDRAMINE HCL 12.5 MG/5ML PO ELIX: 12.5000 mg | ORAL_SOLUTION | ORAL | Status: DC | PRN

## 2018-07-13 MED ORDER — HYDROCHLOROTHIAZIDE 25 MG PO TABS
25.0000 mg | ORAL_TABLET | Freq: Every day | ORAL | Status: DC
  Administered 2018-07-14: 25 mg via ORAL
  Filled 2018-07-13: qty 1

## 2018-07-13 MED ORDER — METHOCARBAMOL 500 MG IVPB - SIMPLE MED
INTRAVENOUS | Status: AC
  Filled 2018-07-13: qty 50

## 2018-07-13 MED ORDER — ATROPINE SULFATE 0.4 MG/ML IV SOSY
PREFILLED_SYRINGE | INTRAVENOUS | Status: DC | PRN
  Administered 2018-07-13 (×2): .4 mg via INTRAVENOUS

## 2018-07-13 MED ORDER — FLEET ENEMA 7-19 GM/118ML RE ENEM: 1.0000 | ENEMA | Freq: Once | RECTAL | Status: DC | PRN

## 2018-07-13 MED ORDER — FENTANYL CITRATE (PF) 250 MCG/5ML IJ SOLN
INTRAMUSCULAR | Status: AC
  Filled 2018-07-13: qty 5

## 2018-07-13 MED ORDER — METHOCARBAMOL 500 MG IVPB - SIMPLE MED
500.0000 mg | Freq: Four times a day (QID) | INTRAVENOUS | Status: DC | PRN
  Administered 2018-07-13: 500 mg via INTRAVENOUS
  Filled 2018-07-13: qty 50

## 2018-07-13 MED ORDER — DEXAMETHASONE SODIUM PHOSPHATE 10 MG/ML IJ SOLN
INTRAMUSCULAR | Status: DC | PRN
  Administered 2018-07-13: 10 mg via INTRAVENOUS

## 2018-07-13 MED ORDER — OXYCODONE HCL 5 MG PO TABS
10.0000 mg | ORAL_TABLET | ORAL | Status: DC | PRN
  Administered 2018-07-14: 10 mg via ORAL
  Filled 2018-07-13: qty 2
  Filled 2018-07-13: qty 3

## 2018-07-13 MED ORDER — METHOCARBAMOL 500 MG PO TABS: 500.0000 mg | ORAL_TABLET | Freq: Four times a day (QID) | ORAL | Status: DC | PRN

## 2018-07-13 MED ORDER — AMITRIPTYLINE HCL 100 MG PO TABS
200.0000 mg | ORAL_TABLET | Freq: Every day | ORAL | Status: DC
  Administered 2018-07-13: 200 mg via ORAL
  Filled 2018-07-13 (×2): qty 2

## 2018-07-13 MED ORDER — FENTANYL CITRATE (PF) 250 MCG/5ML IJ SOLN
INTRAMUSCULAR | Status: DC | PRN
  Administered 2018-07-13 (×2): 50 ug via INTRAVENOUS
  Administered 2018-07-13: 150 ug via INTRAVENOUS

## 2018-07-13 MED ORDER — LIDOCAINE 2% (20 MG/ML) 5 ML SYRINGE
INTRAMUSCULAR | Status: AC
  Filled 2018-07-13: qty 5

## 2018-07-13 MED ORDER — HYDROMORPHONE HCL 1 MG/ML IJ SOLN: 0.5000 mg | INTRAMUSCULAR | Status: DC | PRN

## 2018-07-13 MED ORDER — ONDANSETRON HCL 4 MG/2ML IJ SOLN
INTRAMUSCULAR | Status: DC | PRN
  Administered 2018-07-13: 4 mg via INTRAVENOUS

## 2018-07-13 MED ORDER — POLYETHYLENE GLYCOL 3350 17 G PO PACK: 17.0000 g | PACK | Freq: Every day | ORAL | Status: DC | PRN

## 2018-07-13 MED ORDER — PROPOFOL 10 MG/ML IV BOLUS
INTRAVENOUS | Status: DC | PRN
  Administered 2018-07-13: 50 mg via INTRAVENOUS
  Administered 2018-07-13: 200 mg via INTRAVENOUS

## 2018-07-13 MED ORDER — SUGAMMADEX SODIUM 200 MG/2ML IV SOLN
INTRAVENOUS | Status: DC | PRN
  Administered 2018-07-13: 400 mg via INTRAVENOUS

## 2018-07-13 MED ORDER — SODIUM CHLORIDE 0.9 % IR SOLN
Status: DC | PRN
  Administered 2018-07-13: 1000 mL

## 2018-07-13 MED ORDER — EPHEDRINE 5 MG/ML INJ
INTRAVENOUS | Status: AC
  Filled 2018-07-13: qty 10

## 2018-07-13 MED ORDER — CHLORHEXIDINE GLUCONATE 4 % EX LIQD: 60.0000 mL | Freq: Once | CUTANEOUS | Status: DC

## 2018-07-13 MED ORDER — ONDANSETRON HCL 4 MG/2ML IJ SOLN: 4.0000 mg | Freq: Four times a day (QID) | INTRAMUSCULAR | Status: DC | PRN

## 2018-07-13 MED ORDER — SODIUM CHLORIDE 0.9 % IV SOLN
INTRAVENOUS | Status: DC
  Administered 2018-07-13: 18:00:00 via INTRAVENOUS

## 2018-07-13 MED ORDER — SUCCINYLCHOLINE CHLORIDE 200 MG/10ML IV SOSY
PREFILLED_SYRINGE | INTRAVENOUS | Status: DC | PRN
  Administered 2018-07-13: 140 mg via INTRAVENOUS

## 2018-07-13 MED ORDER — METOCLOPRAMIDE HCL 5 MG PO TABS: 5.0000 mg | ORAL_TABLET | Freq: Three times a day (TID) | ORAL | Status: DC | PRN

## 2018-07-13 SURGICAL SUPPLY — 37 items
BLADE SAG 18X100X1.27 (BLADE) ×3 IMPLANT
CLOSURE WOUND 1/2 X4 (GAUZE/BANDAGES/DRESSINGS) ×1
COVER PERINEAL POST (MISCELLANEOUS) ×3 IMPLANT
COVER SURGICAL LIGHT HANDLE (MISCELLANEOUS) ×3 IMPLANT
COVER WAND RF STERILE (DRAPES) IMPLANT
CUP ACETBLR 54 OD PINNACLE (Hips) ×3 IMPLANT
DECANTER SPIKE VIAL GLASS SM (MISCELLANEOUS) ×3 IMPLANT
DRAPE STERI IOBAN 125X83 (DRAPES) ×3 IMPLANT
DRAPE U-SHAPE 47X51 STRL (DRAPES) ×6 IMPLANT
DRSG ADAPTIC 3X8 NADH LF (GAUZE/BANDAGES/DRESSINGS) ×3 IMPLANT
DRSG MEPILEX BORDER 4X4 (GAUZE/BANDAGES/DRESSINGS) ×3 IMPLANT
DRSG MEPILEX BORDER 4X8 (GAUZE/BANDAGES/DRESSINGS) ×3 IMPLANT
DURAPREP 26ML APPLICATOR (WOUND CARE) ×3 IMPLANT
ELECT REM PT RETURN 15FT ADLT (MISCELLANEOUS) ×3 IMPLANT
EVACUATOR 1/8 PVC DRAIN (DRAIN) ×3 IMPLANT
GLOVE BIO SURGEON STRL SZ 6 (GLOVE) ×6 IMPLANT
GLOVE BIO SURGEON STRL SZ8 (GLOVE) ×3 IMPLANT
GLOVE BIOGEL PI IND STRL 6.5 (GLOVE) ×2 IMPLANT
GLOVE BIOGEL PI IND STRL 8 (GLOVE) ×1 IMPLANT
GLOVE BIOGEL PI INDICATOR 6.5 (GLOVE) ×4
GLOVE BIOGEL PI INDICATOR 8 (GLOVE) ×2
GOWN STRL REUS W/TWL LRG LVL3 (GOWN DISPOSABLE) ×9 IMPLANT
HEAD CERAMIC DELTA 36 PLUS 1.5 (Hips) ×3 IMPLANT
HOLDER FOLEY CATH W/STRAP (MISCELLANEOUS) ×3 IMPLANT
KIT TURNOVER KIT A (KITS) IMPLANT
LINER MARATHON NEUT +4X54X36 (Hips) ×3 IMPLANT
MANIFOLD NEPTUNE II (INSTRUMENTS) ×3 IMPLANT
PACK ANTERIOR HIP CUSTOM (KITS) ×3 IMPLANT
STEM FEM ACTIS HIGH SZ8 (Stem) ×3 IMPLANT
STRIP CLOSURE SKIN 1/2X4 (GAUZE/BANDAGES/DRESSINGS) ×2 IMPLANT
SUT ETHIBOND NAB CT1 #1 30IN (SUTURE) ×3 IMPLANT
SUT MNCRL AB 4-0 PS2 18 (SUTURE) ×3 IMPLANT
SUT STRATAFIX 0 PDS 27 VIOLET (SUTURE) ×3
SUT VIC AB 2-0 CT1 27 (SUTURE) ×4
SUT VIC AB 2-0 CT1 TAPERPNT 27 (SUTURE) ×2 IMPLANT
SUTURE STRATFX 0 PDS 27 VIOLET (SUTURE) ×1 IMPLANT
YANKAUER SUCT BULB TIP 10FT TU (MISCELLANEOUS) ×3 IMPLANT

## 2018-07-13 NOTE — Interval H&P Note (Signed)
History and Physical Interval Note:  07/13/2018 11:00 AM  Hunter Swanson  has presented today for surgery, with the diagnosis of right hip osteoarthritis.  The various methods of treatment have been discussed with the patient and family. After consideration of risks, benefits and other options for treatment, the patient has consented to  Procedure(s) with comments: [place] (Right) - 144min as a surgical intervention.  The patient's history has been reviewed, patient examined, no change in status, stable for surgery.  I have reviewed the patient's chart and labs.  Questions were answered to the patient's satisfaction.     [name]

## 2018-07-13 NOTE — Anesthesia Procedure Notes (Signed)
Procedure Name: Intubation Date/Time: 07/13/2018 12:30 PM Performed by: Sharlette Dense, CRNA Patient Re-evaluated:Patient Re-evaluated prior to induction Oxygen Delivery Method: Circle system utilized Preoxygenation: Pre-oxygenation with 100% oxygen Induction Type: IV induction and Rapid sequence Laryngoscope Size: Miller and 3 Grade View: Grade I Tube type: Oral Tube size: 8.0 mm Number of attempts: 1 Airway Equipment and Method: Stylet Placement Confirmation: ETT inserted through vocal cords under direct vision,  positive ETCO2 and breath sounds checked- equal and bilateral Secured at: 23 cm Tube secured with: Tape Dental Injury: Teeth and Oropharynx as per pre-operative assessment

## 2018-07-13 NOTE — Discharge Instructions (Addendum)
°Dr. Frank Aluisio °Total Joint Specialist °[facility] °[address] °[place], [place] [address] °[phone] ° °ANTERIOR APPROACH TOTAL HIP REPLACEMENT POSTOPERATIVE DIRECTIONS ° ° °Hip Rehabilitation, Guidelines Following Surgery  °The results of a hip operation are greatly improved after range of motion and muscle strengthening exercises. Follow all safety measures which are given to protect your hip. If any of these exercises cause increased pain or swelling in your joint, decrease the amount until you are comfortable again. Then slowly increase the exercises. Call your caregiver if you have problems or questions.  ° °HOME CARE INSTRUCTIONS  °• Remove items at home which could result in a fall. This includes throw rugs or furniture in walking pathways.  °· ICE to the affected hip every three hours for 30 minutes at a time and then as needed for pain and swelling.  Continue to use ice on the hip for pain and swelling from surgery. You may notice swelling that will progress down to the foot and ankle.  This is normal after surgery.  Elevate the leg when you are not up walking on it.   °· Continue to use the breathing machine which will help keep your temperature down.  It is common for your temperature to cycle up and down following surgery, especially at night when you are not up moving around and exerting yourself.  The breathing machine keeps your lungs expanded and your temperature down. ° °DIET °You may resume your previous home diet once your are discharged from the hospital. ° °DRESSING / WOUND CARE / SHOWERING °You may change your dressing 3-5 days after surgery.  Then change the dressing every day with sterile gauze.  Please use good hand washing techniques before changing the dressing.  Do not use any lotions or creams on the incision until instructed by your surgeon. °You may start showering once you are discharged home but do not submerge the incision under water. Just pat the  incision dry and apply a dry gauze dressing on daily. °Change the surgical dressing daily and reapply a dry dressing each time. ° °ACTIVITY °Walk with your walker as instructed. °Use walker as long as suggested by your caregivers. °Avoid periods of inactivity such as sitting longer than an hour when not asleep. This helps prevent blood clots.  °You may resume a sexual relationship in one month or when given the OK by your doctor.  °You may return to work once you are cleared by your doctor.  °Do not drive a car for 6 weeks or until released by you surgeon.  °Do not drive while taking narcotics. ° °WEIGHT BEARING °Weight bearing as tolerated with assist device (walker, cane, etc) as directed, use it as long as suggested by your surgeon or therapist, typically at least 4-6 weeks. ° °POSTOPERATIVE CONSTIPATION PROTOCOL °Constipation - defined medically as fewer than three stools per week and severe constipation as less than one stool per week. ° °One of the most common issues patients have following surgery is constipation.  Even if you have a regular bowel pattern at home, your normal regimen is likely to be disrupted due to multiple reasons following surgery.  Combination of anesthesia, postoperative narcotics, change in appetite and fluid intake all can affect your bowels.  In order to avoid complications following surgery, here are some recommendations in order to help you during your recovery period. ° °Colace (docusate) - Pick up an over-the-counter form of Colace or another stool softener and take twice a day   as long as you are requiring postoperative pain medications.  Take with a full glass of water daily.  If you experience loose stools or diarrhea, hold the colace until you stool forms back up.  If your symptoms do not get better within 1 week or if they get worse, check with your doctor. ° °Dulcolax (bisacodyl) - Pick up over-the-counter and take as directed by the product packaging as needed to assist with  the movement of your bowels.  Take with a full glass of water.  Use this product as needed if not relieved by Colace only.  ° °MiraLax (polyethylene glycol) - Pick up over-the-counter to have on hand.  MiraLax is a solution that will increase the amount of water in your bowels to assist with bowel movements.  Take as directed and can mix with a glass of water, juice, soda, coffee, or tea.  Take if you go more than two days without a movement. °Do not use MiraLax more than once per day. Call your doctor if you are still constipated or irregular after using this medication for 7 days in a row. ° °If you continue to have problems with postoperative constipation, please contact the office for further assistance and recommendations.  If you experience "the worst abdominal pain ever" or develop nausea or vomiting, please contact the office immediatly for further recommendations for treatment. ° °ITCHING ° If you experience itching with your medications, try taking only a single pain pill, or even half a pain pill at a time.  You can also use Benadryl over the counter for itching or also to help with sleep.  ° °TED HOSE STOCKINGS °Wear the elastic stockings on both legs for three weeks following surgery during the day but you may remove then at night for sleeping. ° °MEDICATIONS °See your medication summary on the “After Visit Summary” that the nursing staff will review with you prior to discharge.  You may have some home medications which will be placed on hold until you complete the course of blood thinner medication.  It is important for you to complete the blood thinner medication as prescribed by your surgeon.  Continue your approved medications as instructed at time of discharge. ° °PRECAUTIONS °If you experience chest pain or shortness of breath - call 911 immediately for transfer to the hospital emergency department.  °If you develop a fever greater that 101 F, purulent drainage from wound, increased redness or  drainage from wound, foul odor from the wound/dressing, or calf pain - CONTACT YOUR SURGEON.   °                                                °FOLLOW-UP APPOINTMENTS °Make sure you keep all of your appointments after your operation with your surgeon and caregivers. You should call the office at the above phone number and make an appointment for approximately two weeks after the date of your surgery or on the date instructed by your surgeon outlined in the "After Visit Summary". ° °RANGE OF MOTION AND STRENGTHENING EXERCISES  °These exercises are designed to help you keep full movement of your hip joint. Follow your caregiver's or physical therapist's instructions. Perform all exercises about fifteen times, three times per day or as directed. Exercise both hips, even if you have had only one joint replacement. These exercises can be done on   a training (exercise) mat, on the floor, on a table or on a bed. Use whatever works the best and is most comfortable for you. Use music or television while you are exercising so that the exercises are a pleasant break in your day. This will make your life better with the exercises acting as a break in routine you can look forward to.  °• Lying on your back, slowly slide your foot toward your buttocks, raising your knee up off the floor. Then slowly slide your foot back down until your leg is straight again.  °• Lying on your back spread your legs as far apart as you can without causing discomfort.  °• Lying on your side, raise your upper leg and foot straight up from the floor as far as is comfortable. Slowly lower the leg and repeat.  °• Lying on your back, tighten up the muscle in the front of your thigh (quadriceps muscles). You can do this by keeping your leg straight and trying to raise your heel off the floor. This helps strengthen the largest muscle supporting your knee.  °• Lying on your back, tighten up the muscles of your buttocks both with the legs straight and with  the knee bent at a comfortable angle while keeping your heel on the floor.  ° °IF YOU ARE TRANSFERRED TO A SKILLED REHAB FACILITY °If the patient is transferred to a skilled rehab facility following release from the hospital, a list of the current medications will be sent to the facility for the patient to continue.  When discharged from the skilled rehab facility, please have the facility set up the patient's Home Health Physical Therapy prior to being released. Also, the skilled facility will be responsible for providing the patient with their medications at time of release from the facility to include their pain medication, the muscle relaxants, and their blood thinner medication. If the patient is still at the rehab facility at time of the two week follow up appointment, the skilled rehab facility will also need to assist the patient in arranging follow up appointment in our office and any transportation needs. ° °MAKE SURE YOU:  °• Understand these instructions.  °• Get help right away if you are not doing well or get worse.  ° ° °Pick up stool softner and laxative for home use following surgery while on pain medications. °Do not submerge incision under water. °Please use good hand washing techniques while changing dressing each day. °May shower starting three days after surgery. °Please use a clean towel to pat the incision dry following showers. °Continue to use ice for pain and swelling after surgery. °Do not use any lotions or creams on the incision until instructed by your surgeon. ° °Information on my medicine - XARELTO® (Rivaroxaban) ° °This medication education was reviewed with me or my healthcare representative as part of my discharge preparation.  The pharmacist that spoke with me during my hospital stay was:   ° °Why was Xarelto® prescribed for you? °Xarelto® was prescribed for you to reduce the risk of blood clots forming after orthopedic surgery. The medical term for these abnormal blood clots is  venous thromboembolism (VTE). ° °What do you need to know about xarelto® ? °Take your Xarelto® ONCE DAILY at the same time every day. °You may take it either with or without food. ° °If you have difficulty swallowing the tablet whole, you may crush it and mix in applesauce just prior to taking your dose. ° °Take Xarelto®   exactly as prescribed by your doctor and DO NOT stop taking Xarelto® without talking to the doctor who prescribed the medication.  Stopping without other VTE prevention medication to take the place of Xarelto® may increase your risk of developing a clot. ° °After discharge, you should have regular check-up appointments with your healthcare provider that is prescribing your Xarelto®.   ° °What do you do if you miss a dose? °If you miss a dose, take it as soon as you remember on the same day then continue your regularly scheduled once daily regimen the next day. Do not take two doses of Xarelto® on the same day.  ° °Important Safety Information °A possible side effect of Xarelto® is bleeding. You should call your healthcare provider right away if you experience any of the following: °? Bleeding from an injury or your nose that does not stop. °? Unusual colored urine (red or dark brown) or unusual colored stools (red or black). °? Unusual bruising for unknown reasons. °? A serious fall or if you hit your head (even if there is no bleeding). ° °Some medicines may interact with Xarelto® and might increase your risk of bleeding while on Xarelto®. To help avoid this, consult your healthcare provider or pharmacist prior to using any new prescription or non-prescription medications, including herbals, vitamins, non-steroidal anti-inflammatory drugs (NSAIDs) and supplements. ° °This website has more information on Xarelto®: www.xarelto.com. ° ° ° ° °

## 2018-07-13 NOTE — Transfer of Care (Signed)
Immediate Anesthesia Transfer of Care Note  Patient: Hunter Swanson  Procedure(s) Performed: TOTAL HIP ARTHROPLASTY ANTERIOR APPROACH (Right Hip)  Patient Location: PACU  Anesthesia Type:General  Level of Consciousness: drowsy  Airway & Oxygen Therapy: Patient Spontanous Breathing and Patient connected to face mask oxygen  Post-op Assessment: Report given to RN and Post -op Vital signs reviewed and stable  Post vital signs: Reviewed and stable  Last Vitals:  Vitals Value Taken Time  BP 132/68 07/13/18 1424  Temp    Pulse 75 07/13/18 1425  Resp 8 07/13/18 1425  SpO2 97 % 07/13/18 1425  Vitals shown include unvalidated device data.  Last Pain:  Vitals:   07/13/18 1109  TempSrc:   PainSc: 2       Patients Stated Pain Goal: 4 (00/34/91 7915)  Complications: No apparent anesthesia complications

## 2018-07-13 NOTE — Evaluation (Signed)
Physical Therapy Evaluation Patient Details Name: Hunter Swanson MRN: [id number] DOB: Jul 20, 1960 Today's Date: 07/13/2018   History of Present Illness  58 yo male s/p R DA-THA on 07/13/18. PMH includes OA, HTN, anxiety, lumbar fusion x2, L DA-THA, sleep apnea, L shoulder arthroscopy.  Clinical Impression   Pt presents with mild R hip pain, post-operative weakness, increased time and effort to perform mobility tasks, and decreased activity tolerance. Pt to benefit from acute PT to address deficits. Pt ambulated hallway distance with RW with min guard assist, verbal cuing for form and safety provided. Pt educated on ankle pumps (20/hour) to perform this afternoon/evening to increase circulation, to pt's tolerance and limited by pain. PT to progress mobility as tolerated, and will continue to follow acutely.        Follow Up Recommendations Follow surgeon's recommendation for DC plan and follow-up therapies;Supervision for mobility/OOB(HEP)    Equipment Recommendations  None recommended by PT    Recommendations for Other Services       Precautions / Restrictions Precautions Precautions: Fall Restrictions Weight Bearing Restrictions: No Other Position/Activity Restrictions: WBAT      Mobility  Bed Mobility Overal bed mobility: Needs Assistance Bed Mobility: Supine to Sit     Supine to sit: Min assist;HOB elevated     General bed mobility comments: Min assist for RLE management. Increased time/effort.  Transfers Overall transfer level: Needs assistance Equipment used: Rolling walker (2 wheeled) Transfers: Sit to/from Stand Sit to Stand: Min guard;From elevated surface         General transfer comment: Min guard for safety, verbal cuing for hand placement when rising.  Ambulation/Gait Ambulation/Gait assistance: Min guard Gait Distance (Feet): 70 Feet Assistive device: Rolling walker (2 wheeled) Gait Pattern/deviations: Step-to pattern;Decreased step length -  left;Decreased step length - right;Trunk flexed Gait velocity: decr   General Gait Details: Min guard for safety. Verbal cuing for placement in RW, sequencing.  Stairs            Wheelchair Mobility    Modified Rankin (Stroke Patients Only)       Balance Overall balance assessment: Mild deficits observed, not formally tested                                           Pertinent Vitals/Pain Pain Assessment: 0-10 Pain Score: 2  Pain Location: R hip Pain Descriptors / Indicators: Sore Pain Intervention(s): Limited activity within patient's tolerance;Monitored during session;Repositioned;Ice applied;Premedicated before session    [place] expects to be discharged to:: Private residence Living Arrangements: Spouse/significant other;Children(daughter, grandchildren) Available Help at Discharge: Family Type of Home: House Home Access: Stairs to enter Entrance Stairs-Rails: None Entrance Stairs-Number of Steps: 1 Home Layout: Laundry or work area in basement;Able to live on main level with bedroom/bathroom;Multi-level Home Equipment: Environmental consultant - 2 wheels;Crutches;Shower seat - built in      Prior Function Level of Independence: Independent               Hand Dominance   Dominant Hand: Right    Extremity/Trunk Assessment   Upper Extremity Assessment Upper Extremity Assessment: Overall WFL for tasks assessed    Lower Extremity Assessment Lower Extremity Assessment: Overall WFL for tasks assessed;RLE deficits/detail RLE Deficits / Details: suspected post-surgical weakness; able to perform ankle pumps, quad set, heel slide, SLR with lift assist during bed mobility RLE Sensation: WNL    Cervical /  Trunk Assessment Cervical / Trunk Assessment: Normal  Communication   Communication: No difficulties  Cognition Arousal/Alertness: Awake/alert Behavior During Therapy: WFL for tasks assessed/performed Overall Cognitive Status:  Within Functional Limits for tasks assessed                                        General Comments      Exercises     Assessment/Plan    PT Assessment Patient needs continued PT services  PT Problem List Decreased strength;Decreased mobility;Decreased range of motion;Decreased activity tolerance;Decreased balance;Decreased knowledge of use of DME;Pain       PT Treatment Interventions DME instruction;Therapeutic activities;Therapeutic exercise;Gait training;Patient/family education;Balance training;Stair training;Functional mobility training    PT Goals (Current goals can be found in the Care Plan section)  Acute Rehab PT Goals Patient Stated Goal: get stronger PT Goal Formulation: With patient Time For Goal Achievement: 07/20/18 Potential to Achieve Goals: Good    Frequency 7X/week   Barriers to discharge        Co-evaluation               AM-PAC PT "6 Clicks" Mobility  Outcome Measure Help needed turning from your back to your side while in a flat bed without using bedrails?: A Little Help needed moving from lying on your back to sitting on the side of a flat bed without using bedrails?: A Little Help needed moving to and from a bed to a chair (including a wheelchair)?: A Little Help needed standing up from a chair using your arms (e.g., wheelchair or bedside chair)?: A Little Help needed to walk in hospital room?: A Little Help needed climbing 3-5 steps with a railing? : A Little 6 Click Score: 18    End of Session Equipment Utilized During Treatment: Gait belt Activity Tolerance: No increased pain;Patient tolerated treatment well Patient left: in bed;with bed alarm set;with SCD's reapplied;with call bell/phone within reach Nurse Communication: Mobility status PT Visit Diagnosis: Other abnormalities of gait and mobility (R26.89);Difficulty in walking, not elsewhere classified (R26.2)    Time: [id number] PT Time Calculation (min) (ACUTE  ONLY): 20 min   Charges:   PT Evaluation $PT Eval Low Complexity: 1 Low          Nicola PoliceAlexa D Camren Lipsett, PT Acute Rehabilitation Services Pager [phone]  Office [phone]   Tyrone AppleAlexa D Despina Hiddenure 07/13/2018, 6:39 PM

## 2018-07-13 NOTE — Anesthesia Postprocedure Evaluation (Signed)
Anesthesia Post Note  Patient: Hunter Swanson  Procedure(s) Performed: TOTAL HIP ARTHROPLASTY ANTERIOR APPROACH (Right Hip)     Patient location during evaluation: PACU Anesthesia Type: General Level of consciousness: awake and alert Pain management: pain level controlled Vital Signs Assessment: post-procedure vital signs reviewed and stable Respiratory status: spontaneous breathing, nonlabored ventilation, respiratory function stable and patient connected to nasal cannula oxygen Cardiovascular status: blood pressure returned to baseline and stable Postop Assessment: no apparent nausea or vomiting Anesthetic complications: no    Last Vitals:  Vitals:   07/13/18 1530 07/13/18 1545  BP: 113/78 120/71  Pulse: 79 84  Resp: 14 19  Temp: 36.7 C   SpO2: 95% 95%    Last Pain:  Vitals:   07/13/18 1515  TempSrc:   PainSc: 8                  Dayzee Trower,W. EDMOND

## 2018-07-13 NOTE — Op Note (Signed)
OPERATIVE REPORT- TOTAL HIP ARTHROPLASTY   PREOPERATIVE DIAGNOSIS: Osteoarthritis of the Right hip.   POSTOPERATIVE DIAGNOSIS: Osteoarthritis of the Right  hip.   PROCEDURE: Right total hip arthroplasty, anterior approach.   SURGEON: Gaynelle Arabian, MD   ASSISTANT: Griffith Citron, PA-C  ANESTHESIA:  General  ESTIMATED BLOOD LOSS:-200 mL    DRAINS: Hemovac x1.   COMPLICATIONS: None   CONDITION: PACU - hemodynamically stable.   BRIEF CLINICAL NOTE: Hunter Swanson is a 58 y.o. male who has advanced end-  stage arthritis of their Right  hip with progressively worsening pain and  dysfunction.The patient has failed nonoperative management and presents for  total hip arthroplasty.   PROCEDURE IN DETAIL: After successful administration of spinal  anesthetic, the traction boots for the [facility] bed were placed on both  feet and the patient was placed onto the [facility] bed, boots placed into the leg  holders. The Right hip was then isolated from the perineum with plastic  drapes and prepped and draped in the usual sterile fashion. ASIS and  greater trochanter were marked and a oblique incision was made, starting  at about 1 cm lateral and 2 cm distal to the ASIS and coursing towards  the anterior cortex of the femur. The skin was cut with a 10 blade  through subcutaneous tissue to the level of the fascia overlying the  tensor fascia lata muscle. The fascia was then incised in line with the  incision at the junction of the anterior third and posterior 2/3rd. The  muscle was teased off the fascia and then the interval between the TFL  and the rectus was developed. The Hohmann retractor was then placed at  the top of the femoral neck over the capsule. The vessels overlying the  capsule were cauterized and the fat on top of the capsule was removed.  A Hohmann retractor was then placed anterior underneath the rectus  femoris to give exposure to the entire anterior capsule. A T-shaped   capsulotomy was performed. The edges were tagged and the femoral head  was identified.       Osteophytes are removed off the superior acetabulum.  The femoral neck was then cut in situ with an oscillating saw. Traction  was then applied to the left lower extremity utilizing the [facility]  traction. The femoral head was then removed. Retractors were placed  around the acetabulum and then circumferential removal of the labrum was  performed. Osteophytes were also removed. Reaming starts at 49 mm to  medialize and  Increased in 2 mm increments to 53 mm. We reamed in  approximately 40 degrees of abduction, 20 degrees anteversion. A 54 mm  pinnacle acetabular shell was then impacted in anatomic position under  fluoroscopic guidance with excellent purchase. We did not need to place  any additional dome screws. A 36 mm neutral + 4 marathon liner was then  placed into the acetabular shell.       The femoral lift was then placed along the lateral aspect of the femur  just distal to the vastus ridge. The leg was  externally rotated and capsule  was stripped off the inferior aspect of the femoral neck down to the  level of the lesser trochanter, this was done with electrocautery. The femur was lifted after this was performed. The  leg was then placed in an extended and adducted position essentially delivering the femur. We also removed the capsule superiorly and the piriformis from the piriformis  fossa to gain excellent exposure of the  proximal femur. Rongeur was used to remove some cancellous bone to get  into the lateral portion of the proximal femur for placement of the  initial starter reamer. The starter broaches was placed  the starter broach  and was shown to go down the center of the canal. Broaching  with the Actis system was then performed starting at size 0  coursing  Up to size 8. A size 8 had excellent torsional and rotational  and axial stability. The trial high offset neck was then placed   with a 36 + 1.5 trial head. The hip was then reduced. We confirmed that  the stem was in the canal both on AP and lateral x-rays. It also has excellent sizing. The hip was reduced with outstanding stability through full extension and full external rotation.. AP pelvis was taken and the leg lengths were measured and found to be equal. Hip was then dislocated again and the femoral head and neck removed. The  femoral broach was removed. Size 8 Actis stem with a high offset  neck was then impacted into the femur following native anteversion. Has  excellent purchase in the canal. Excellent torsional and rotational and  axial stability. It is confirmed to be in the canal on AP and lateral  fluoroscopic views. The 36 + 1.5 ceramic head was placed and the hip  reduced with outstanding stability. Again AP pelvis was taken and it  confirmed that the leg lengths were equal. The wound was then copiously  irrigated with saline solution and the capsule reattached and repaired  with Ethibond suture. 30 ml of .25% Bupivicaine was  injected into the capsule and into the edge of the tensor fascia lata as well as subcutaneous tissue. The fascia overlying the tensor fascia lata was then closed with a running #1 V-Loc. Subcu was closed with interrupted 2-0 Vicryl and subcuticular running 4-0 Monocryl. Incision was cleaned  and dried. Steri-Strips and a bulky sterile dressing applied. Hemovac  drain was hooked to suction and then the patient was awakened and transported to  recovery in stable condition.        Please note that a surgical assistant was a medical necessity for this procedure to perform it in a safe and expeditious manner. Assistant was necessary to provide appropriate retraction of vital neurovascular structures and to prevent femoral fracture and allow for anatomic placement of the prosthesis.  Gaynelle Arabian, M.D.

## 2018-07-13 NOTE — Anesthesia Preprocedure Evaluation (Addendum)
Anesthesia Evaluation  Patient identified by MRN, date of birth, ID band Patient awake    Reviewed: Allergy & Precautions, H&P , NPO status , Patient's Chart, lab work & pertinent test results  Airway Mallampati: II  TM Distance: >3 FB Neck ROM: Full    Dental no notable dental hx. (+) Teeth Intact, Dental Advisory Given   Pulmonary neg pulmonary ROS, sleep apnea and Continuous Positive Airway Pressure Ventilation ,    Pulmonary exam normal breath sounds clear to auscultation       Cardiovascular hypertension, Pt. on medications negative cardio ROS   Rhythm:Regular Rate:Normal     Neuro/Psych Anxiety negative neurological ROS  negative psych ROS   GI/Hepatic negative GI ROS, Neg liver ROS,   Endo/Other  negative endocrine ROS  Renal/GU negative Renal ROS  negative genitourinary   Musculoskeletal  (+) Arthritis , Osteoarthritis,    Abdominal   Peds  Hematology negative hematology ROS (+)   Anesthesia Other Findings   Reproductive/Obstetrics negative OB ROS                            Anesthesia Physical Anesthesia Plan  ASA: III  Anesthesia Plan: General   Post-op Pain Management:    Induction: Intravenous  PONV Risk Score and Plan: 3 and Ondansetron, Dexamethasone and Midazolam  Airway Management Planned: Oral ETT  Additional Equipment:   Intra-op Plan:   Post-operative Plan: Extubation in OR  Informed Consent: I have reviewed the patients History and Physical, chart, labs and discussed the procedure including the risks, benefits and alternatives for the proposed anesthesia with the patient or authorized representative who has indicated his/her understanding and acceptance.     Dental advisory given  Plan Discussed with: CRNA  Anesthesia Plan Comments:         Anesthesia Quick Evaluation

## 2018-07-14 ENCOUNTER — Encounter (HOSPITAL_COMMUNITY): Payer: Self-pay | Admitting: Orthopedic Surgery

## 2018-07-14 LAB — BASIC METABOLIC PANEL
Anion gap: 11 (ref 5–15)
BUN: 19 mg/dL (ref 6–20)
CO2: 24 mmol/L (ref 22–32)
Calcium: 9 mg/dL (ref 8.9–10.3)
Chloride: 101 mmol/L (ref 98–111)
Creatinine, Ser: 1.21 mg/dL (ref 0.61–1.24)
GFR calc Af Amer: >60 mL/min (ref >60)
GFR calc non Af Amer: >60 mL/min (ref >60)
Glucose, Bld: 134 mg/dL — ABNORMAL HIGH (ref 70–99)
Potassium: 4.9 mmol/L (ref 3.5–5.1)
Sodium: 136 mmol/L (ref 135–145)

## 2018-07-14 LAB — CBC
HCT: 41.6 % (ref 39.0–52.0)
Hemoglobin: 13.3 g/dL (ref 13.0–17.0)
MCH: 29.8 pg (ref 26.0–34.0)
MCHC: 32 g/dL (ref 30.0–36.0)
MCV: 93.1 fL (ref 80.0–100.0)
Platelets: 279 10*3/uL (ref 150–400)
RBC: 4.47 MIL/uL (ref 4.22–5.81)
RDW: 17.2 % — ABNORMAL HIGH (ref 11.5–15.5)
WBC: 15.1 10*3/uL — ABNORMAL HIGH (ref 4.0–10.5)
nRBC: 0 % (ref 0.0–0.2)

## 2018-07-14 MED ORDER — METHOCARBAMOL 500 MG PO TABS: 500.0000 mg | ORAL_TABLET | Freq: Four times a day (QID) | ORAL | 0 refills | Status: DC | PRN

## 2018-07-14 MED ORDER — OXYCODONE HCL 5 MG PO TABS: 5.0000 mg | ORAL_TABLET | Freq: Four times a day (QID) | ORAL | 0 refills | Status: DC | PRN

## 2018-07-14 MED ORDER — RIVAROXABAN 10 MG PO TABS: 10.0000 mg | ORAL_TABLET | Freq: Every day | ORAL | 0 refills | Status: DC

## 2018-07-14 NOTE — Progress Notes (Signed)
Wasted oxy 5mg  with tiffany g,rn, dropped in patients floor by accident.

## 2018-07-14 NOTE — Plan of Care (Signed)
Patient meets discharge requirements 

## 2018-07-14 NOTE — TOC Transition Note (Signed)
Transition of Care [facility]) - CM/SW Discharge Note   Patient Details  Name: Hunter Swanson MRN: [id number] Date of Birth: 09/05/1960  [facility] CM/SW Contact:  Leeroy Cha, RN Phone Number: 07/14/2018, 10:07 AM   Clinical Narrative:    To home with HEP and Equipment   Final next level of care: Home/Self Care Barriers to Discharge: No Barriers Identified   Patient Goals and CMS Choice Patient states their goals for this hospitalization and ongoing recovery are:: to go home and get better and moving. CMS Medicare.gov Compare Post Acute Care list provided to:: Patient Choice offered to / list presented to : Patient  Discharge Placement                       Discharge Plan and Services   Discharge Planning Services: CM Consult Post Acute Care Choice: Durable Medical Equipment          DME Arranged: Gilford Rile rolling, 3-N-1 DME Agency: [employer] Date DME Agency Contacted: 07/14/18 Time DME Agency Contacted: 0900 Representative spoke with at DME Agency: nathan            Social Determinants of Health ([place]) Interventions     Readmission Risk Interventions No flowsheet data found.

## 2018-07-14 NOTE — Progress Notes (Signed)
   Subjective: 1 Day Post-Op Procedure(s) (LRB): TOTAL HIP ARTHROPLASTY ANTERIOR APPROACH (Right) Patient reports pain as mild.   Patient seen in rounds with Dr. Wynelle Link. Patient is well, and has had no acute complaints or problems. States he had a good night with no issues. Voiding without difficulty. Denies chest pain or SOB. States he is ready to go home. We will continue therapy today.   Objective: Vital signs in last 24 hours: Temp:  [97.6 F (36.4 C)-98.7 F (37.1 C)] 98 F (36.7 C) (06/18 0519) Pulse Rate:  [72-85] 73 (06/18 0519) Resp:  [11-19] 14 (06/18 0519) BP: (113-155)/(65-81) 136/70 (06/18 0519) SpO2:  [92 %-100 %] 99 % (06/18 0519) Weight:  [130.6 kg] 130.6 kg (06/17 1051)  Intake/Output from previous day:  Intake/Output Summary (Last 24 hours) at 07/14/2018 0802 Last data filed at 07/14/2018 0616 Gross per 24 hour  Intake 3373.96 ml  Output 2580 ml  Net 793.96 ml    Labs: Recent Labs    07/14/18 0315  HGB 13.3   Recent Labs    07/14/18 0315  WBC 15.1*  RBC 4.47  HCT 41.6  PLT 279   Recent Labs    07/14/18 0315  NA 136  K 4.9  CL 101  CO2 24  BUN 19  CREATININE 1.21  GLUCOSE 134*  CALCIUM 9.0   Exam: General - Patient is Alert and Oriented Extremity - Neurologically intact Neurovascular intact Sensation intact distally Dorsiflexion/Plantar flexion intact Dressing - dressing C/D/I Motor Function - intact, moving foot and toes well on exam.   Past Medical History:  Diagnosis Date  . Anxiety   . Baker's cyst of knee, left    Small  . Chronic pain of left knee   . History of retinal detachment    right eye s/p repair 03-18-2016  . Hypertension   . 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 OA (osteoarthritis)    knees, hips  . Sleep apnea    use CPAP  . Spinal stenosis, lumbar   . Wears contact lenses     Assessment/Plan: 1 Day Post-Op Procedure(s) (LRB): TOTAL HIP ARTHROPLASTY ANTERIOR APPROACH (Right) Active  Problems:   OA (osteoarthritis) of hip  Estimated body mass index is 36 kg/m as calculated from the following:   Height as of this encounter: 6\' 3"  (1.905 m).   Weight as of this encounter: 130.6 kg. Advance diet Up with therapy D/C IV fluids  DVT Prophylaxis - Xarelto Weight bearing as tolerated. D/C O2 and pulse ox and try on room air. Hemovac pulled without difficulty, will continue therapy.  Plan is to go Home after hospital stay. Plan for discharge with HEP once meeting goals with therapy. Follow-up in the office in 2 weeks.   Theresa Duty, PA-C Orthopedic Surgery 07/14/2018, 8:02 AM

## 2018-07-14 NOTE — Progress Notes (Signed)
Physical Therapy Treatment Patient Details Name: Hunter Swanson MRN: [id number] DOB: [birth year] Today's Date: 07/14/2018    History of Present Illness 58 yo male s/p R DA-THA on 07/13/18. PMH includes OA, HTN, anxiety, lumbar fusion x2, L DA-THA, sleep apnea, L shoulder arthroscopy.    PT Comments    POD # 1 am session This is pt's second THR so very knowledgeable.  Demonstrated and instructed pt how to use belt to self assist LE as a leg lifter.  Assisted with amb in hallway.  Practiced one step. General stair comments: 25% VC's on proper walker placement and proper sequencing. Then returned to room to perform some TE's following HEP handout.  Instructed on proper tech, freq as well as use of ICE.   Addressed all mobility questions, discussed appropriate activity, educated on use of ICE.  Pt ready for D/C to home.    Follow Up Recommendations  Follow surgeon's recommendation for DC plan and follow-up therapies;Supervision for mobility/OOB     Equipment Recommendations  None recommended by PT    Recommendations for Other Services       Precautions / Restrictions Precautions Precautions: Fall Restrictions Weight Bearing Restrictions: No Other Position/Activity Restrictions: WBAT    Mobility  Bed Mobility Overal bed mobility: Needs Assistance Bed Mobility: Supine to Sit     Supine to sit: Supervision;Min guard     General bed mobility comments: demonstarted and instructed how to use a belt to self assist LE off bed  Transfers Overall transfer level: Needs assistance Equipment used: Rolling walker (2 wheeled) Transfers: Sit to/from Stand Sit to Stand: From elevated surface;Supervision         General transfer comment: for safety, verbal cuing for hand placement when rising.  Ambulation/Gait Ambulation/Gait assistance: Supervision Gait Distance (Feet): 85 Feet Assistive device: Rolling walker (2 wheeled) Gait Pattern/deviations: Step-to pattern;Decreased step  length - left;Decreased step length - right;Trunk flexed Gait velocity: decr   General Gait Details: Verbal cuing for placement in RW, sequencing.   Stairs Stairs: Yes Stairs assistance: Supervision;Min guard Stair Management: No rails;Step to pattern;Forwards;With walker Number of Stairs: 1 General stair comments: 25% VC's on proper walker placement and proper sequencing.   Wheelchair Mobility    Modified Rankin (Stroke Patients Only)       Balance                                            Cognition Arousal/Alertness: Awake/alert Behavior During Therapy: WFL for tasks assessed/performed Overall Cognitive Status: Within Functional Limits for tasks assessed                                        Exercises   Total Hip Replacement TE's 10 reps ankle pumps 10 reps knee presses 10 reps heel slides 10 reps SAQ's 10 reps ABD Followed by ICE     General Comments        Pertinent Vitals/Pain Pain Assessment: 0-10 Pain Score: 3  Pain Location: R hip Pain Descriptors / Indicators: Sore;Tender;Tightness Pain Intervention(s): Premedicated before session;Monitored during session;Ice applied    Home Living                      Prior Function  PT Goals (current goals can now be found in the care plan section) Progress towards PT goals: Progressing toward goals    Frequency    7X/week      PT Plan Current plan remains appropriate    Co-evaluation              AM-PAC PT "6 Clicks" Mobility   Outcome Measure  Help needed turning from your back to your side while in a flat bed without using bedrails?: None Help needed moving from lying on your back to sitting on the side of a flat bed without using bedrails?: None Help needed moving to and from a bed to a chair (including a wheelchair)?: None Help needed standing up from a chair using your arms (e.g., wheelchair or bedside chair)?: None Help  needed to walk in hospital room?: None Help needed climbing 3-5 steps with a railing? : A Little 6 Click Score: 23    End of Session Equipment Utilized During Treatment: Gait belt Activity Tolerance: Patient tolerated treatment well Patient left: in bed;with call bell/phone within reach Nurse Communication: (pt ready for D/C to home) PT Visit Diagnosis: Other abnormalities of gait and mobility (R26.89);Difficulty in walking, not elsewhere classified (R26.2)     Time: [id number] PT Time Calculation (min) (ACUTE ONLY): 25 min  Charges:  $Gait Training: 8-22 mins $Therapeutic Exercise: 8-22 mins                     Felecia ShellingLori Otelia Hettinger  PTA Acute  Rehabilitation Services Pager      [phone] Office      [phone]

## 2018-07-18 NOTE — Discharge Summary (Signed)
Physician Discharge Summary   Patient ID: Hunter Swanson MRN: [id number] DOB/AGE: [birth year] 60 y.o.  Admit date: 07/13/2018 Discharge date: 07/14/2018  Primary Diagnosis: Osteoarthritis, right hip   Admission Diagnoses:  Past Medical History:  Diagnosis Date   Anxiety    Baker's cyst of knee, left    Small   Chronic pain of left knee    History of retinal detachment    right eye s/p repair 03-18-2016   Hypertension    Left leg weakness    secondary to left hip   OA (osteoarthritis)    knees, hips   Sleep apnea    use CPAP   Spinal stenosis, lumbar    Wears contact lenses    Discharge Diagnoses:   Active Problems:   OA (osteoarthritis) of hip  Estimated body mass index is 36 kg/m as calculated from the following:   Height as of this encounter: 6\' 3"  (1.905 m).   Weight as of this encounter: 130.6 kg.  Procedure:  Procedure(s) (LRB): TOTAL HIP ARTHROPLASTY ANTERIOR APPROACH (Right)   Consults: None  HPI: Hunter Swanson is a 58 y.o. male who has advanced end-stage arthritis of their Right  hip with progressively worsening pain and dysfunction.The patient has failed nonoperative management and presents for total hip arthroplasty.   Laboratory Data: Admission on 07/13/2018, Discharged on 07/14/2018  Component Date Value Ref Range Status   WBC 07/14/2018 15.1* 4.0 - 10.5 K/uL Final   RBC 07/14/2018 4.47  4.22 - 5.81 MIL/uL Final   Hemoglobin 07/14/2018 13.3  13.0 - 17.0 g/dL Final   HCT 08/65/784606/18/2020 41.6  39.0 - 52.0 % Final   MCV 07/14/2018 93.1  80.0 - 100.0 fL Final   MCH 07/14/2018 29.8  26.0 - 34.0 pg Final   MCHC 07/14/2018 32.0  30.0 - 36.0 g/dL Final   RDW 96/29/528406/18/2020 17.2* 11.5 - 15.5 % Final   Platelets 07/14/2018 279  150 - 400 K/uL Final   nRBC 07/14/2018 0.0  0.0 - 0.2 % Final   Performed at [facility], [address] [place], [place] [address]   Sodium 07/14/2018 136  135 - 145 mmol/L Final   Potassium  07/14/2018 4.9  3.5 - 5.1 mmol/L Final   Chloride 07/14/2018 101  98 - 111 mmol/L Final   CO2 07/14/2018 24  22 - 32 mmol/L Final   Glucose, Bld 07/14/2018 134* 70 - 99 mg/dL Final   BUN 01/02/725306/18/2020 19  6 - 20 mg/dL Final   Creatinine, Ser 07/14/2018 1.21  0.61 - 1.24 mg/dL Final   Calcium 66/44/034706/18/2020 9.0  8.9 - 10.3 mg/dL Final   GFR calc non Af Amer 07/14/2018 >60  >60 mL/min Final   GFR calc Af Amer 07/14/2018 >60  >60 mL/min Final   Anion gap 07/14/2018 11  5 - 15 Final   Performed at [facility], [address] [place], [place] [address]  Hospital Outpatient Visit on 07/09/2018  Component Date Value Ref Range Status   SARS-CoV-2, NAA 07/09/2018 NOT DETECTED  NOT DETECTED Final   Comment: (NOTE) This test was developed and its performance characteristics determined by [facility]. This test has not been FDA cleared or approved. This test has been authorized by FDA under an Emergency Use Authorization (EUA). This test is only authorized for the duration of time the declaration that circumstances exist justifying the authorization of the emergency use of in vitro diagnostic tests for detection of SARS-CoV-2 virus and/or diagnosis of  COVID-19 infection under section 564(b)(1) of the Act, 21 U.S.C. 419QQI-2(L)(7), unless the authorization is terminated or revoked sooner. When diagnostic testing is negative, the possibility of a false negative result should be considered in the context of a patient's recent exposures and the presence of clinical signs and symptoms consistent with COVID-19. An individual without symptoms of COVID-19 and who is not shedding SARS-CoV-2 virus would expect to have a negative (not detected) result in this assay. Performed                           At: [facility] [place], [place] [id number] Rush Farmer MD DE:[id number]    Coronavirus Source 07/09/2018 NASOPHARYNGEAL   Final   Performed at  [facility], [place] [address] [place], [place] [address]  Hospital Outpatient Visit on 07/07/2018  Component Date Value Ref Range Status   ABO/RH(D) 07/07/2018 O POS   Final   Antibody Screen 07/07/2018 NEG   Final   Sample Expiration 07/07/2018 07/16/2018,2359   Final   Extend sample reason 07/07/2018    Final                   Value:NO TRANSFUSIONS OR PREGNANCY IN THE PAST 3 MONTHS Performed at [facility], [place] [address], [place], [place] [address]    MRSA, PCR 07/07/2018 NEGATIVE  NEGATIVE Final   Staphylococcus aureus 07/07/2018 NEGATIVE  NEGATIVE Final   Comment: (NOTE) The Xpert SA Assay (FDA approved for NASAL specimens in patients 56 years of age and older), is one component of a comprehensive surveillance program. It is not intended to diagnose infection nor to guide or monitor treatment. Performed at [facility], [place] [address] [place], [place] [address]    aPTT 07/07/2018 30  24 - 36 seconds Final   Performed at [facility], [place] [address], [place], [place] [address]   WBC 07/07/2018 5.4  4.0 - 10.5 K/uL Final   RBC 07/07/2018 4.54  4.22 - 5.81 MIL/uL Final   Hemoglobin 07/07/2018 13.7  13.0 - 17.0 g/dL Final   HCT 07/07/2018 41.4  39.0 - 52.0 % Final   MCV 07/07/2018 91.2  80.0 - 100.0 fL Final   MCH 07/07/2018 30.2  26.0 - 34.0 pg Final   MCHC 07/07/2018 33.1  30.0 - 36.0 g/dL Final   RDW 07/07/2018 16.9* 11.5 - 15.5 % Final   Platelets 07/07/2018 260  150 - 400 K/uL Final   nRBC 07/07/2018 0.0  0.0 - 0.2 % Final   Performed at [facility], [place] [address] [place], [place] [address]   Sodium 07/07/2018 135  135 - 145 mmol/L Final   Potassium 07/07/2018 3.9  3.5 - 5.1 mmol/L Final   Chloride 07/07/2018 101  98 - 111 mmol/L Final   CO2 07/07/2018 25  22 - 32 mmol/L Final   Glucose, Bld 07/07/2018 90  70 - 99 mg/dL Final   BUN 07/07/2018 22* 6 - 20 mg/dL Final    Creatinine, Ser 07/07/2018 1.05  0.61 - 1.24 mg/dL Final   Calcium 07/07/2018 9.2  8.9 - 10.3 mg/dL Final   Total Protein 07/07/2018 6.9  6.5 - 8.1 g/dL Final   Albumin 07/07/2018 3.9  3.5 - 5.0 g/dL Final   AST 07/07/2018 33  15 - 41 U/L Final   ALT 07/07/2018 42  0 - 44 U/L Final   Alkaline Phosphatase 07/07/2018  87  38 - 126 U/L Final   Total Bilirubin 07/07/2018 0.4  0.3 - 1.2 mg/dL Final   GFR calc non Af Amer 07/07/2018 >60  >60 mL/min Final   GFR calc Af Amer 07/07/2018 >60  >60 mL/min Final   Anion gap 07/07/2018 9  5 - 15 Final   Performed at [facility], [address] [place], [place] [address]   Prothrombin Time 07/07/2018 13.4  11.4 - 15.2 seconds Final   INR 07/07/2018 1.0  0.8 - 1.2 Final   Comment: (NOTE) INR goal varies based on device and disease states. Performed at [facility], [address] [place], [place] [address]      X-Rays:Dg Pelvis Portable  Result Date: 07/13/2018 CLINICAL DATA:  Right hip replacement. EXAM: PORTABLE PELVIS 1-2 VIEWS COMPARISON:  None. FINDINGS: The patient is status post right hip replacement. Hardware is in good position. Postoperative air seen on the right. A surgical drain is noted. The patient is also status post left hip replacement which appears nonacute. IMPRESSION: Right hip replacement as above. Electronically Signed   By: Gerome Samavid  Williams III M.D   On: 07/13/2018 15:42   Dg C-arm 1-60 Min-no Report  Result Date: 07/13/2018 Fluoroscopy was utilized by the requesting physician.  No radiographic interpretation.   Dg Hip Operative Unilat W Or W/o Pelvis Right  Result Date: 07/13/2018 CLINICAL DATA:  Right hip replacement. EXAM: OPERATIVE RIGHT HIP (WITH PELVIS IF PERFORMED) 3 VIEWS TECHNIQUE: Fluoroscopic spot image(s) were submitted for interpretation post-operatively. COMPARISON:  None. FINDINGS: The patient is status post right hip replacement. Hardware is in good position.  IMPRESSION: Right hip replacement as above. Electronically Signed   By: Gerome Samavid  Williams III M.D   On: 07/13/2018 15:42    EKG: Orders placed or performed during the hospital encounter of 07/27/17   EKG 12 lead   EKG 12 lead     Hospital Course: Hunter LaityJeffrey D Reames is a 58 y.o. who was [facility] to [facility]. They were brought to the operating room on 07/13/2018 and underwent Procedure(s): TOTAL HIP ARTHROPLASTY ANTERIOR APPROACH.  Patient tolerated the procedure well and was later transferred to the recovery room and then to the orthopaedic floor for postoperative care. They were given PO and IV analgesics for pain control following their surgery. They were given 24 hours of postoperative antibiotics of  Anti-infectives (From admission, onward)   Start     Dose/Rate Route Frequency Ordered Stop   07/13/18 1830  ceFAZolin (ANCEF) IVPB 2g/100 mL premix     2 g 200 mL/hr over 30 Minutes Intravenous Every 6 hours 07/13/18 1615 07/14/18 0006   07/13/18 0600  ceFAZolin (ANCEF) 3 g in dextrose 5 % 50 mL IVPB     3 g 100 mL/hr over 30 Minutes Intravenous On call to O.R. 07/12/18 [id number] 07/13/18 1241     and started on DVT prophylaxis in the form of Xarelto.   PT and OT were ordered for total joint protocol. Discharge planning consulted to help with postop disposition and equipment needs.  Patient had a good night on the evening of surgery. They started to get up OOB with therapy on POD #0. Pt was seen during rounds and was ready to go home pending progress with therapy. Hemovac drain was pulled without difficulty. He worked with therapy on POD #1 and was meeting his goals. Pt was discharged to home later that day in stable condition.  Diet: Regular diet Activity: WBAT  Follow-up: in 2 weeks Disposition: Home with HEP Discharged Condition: stable   Discharge Instructions    Call MD / Call 911   Complete by: As directed    If you experience chest pain or shortness of breath, CALL 911 and  be transported to the hospital emergency room.  If you develope a fever above 101 F, pus (white drainage) or increased drainage or redness at the wound, or calf pain, call your surgeon's office.   Change dressing   Complete by: As directed    You may change your dressing on Friday, then change the dressing daily with sterile 4 x 4 inch gauze dressing and paper tape.   Constipation Prevention   Complete by: As directed    Drink plenty of fluids.  Prune juice may be helpful.  You may use a stool softener, such as Colace (over the counter) 100 mg twice a day.  Use MiraLax (over the counter) for constipation as needed.   Diet - low sodium heart healthy   Complete by: As directed    Discharge instructions   Complete by: As directed    Dr. Ollen GrossFrank Aluisio Total Joint Specialist [facility] [address] [place], [place] [address] [phone]  ANTERIOR APPROACH TOTAL HIP REPLACEMENT POSTOPERATIVE DIRECTIONS   Hip Rehabilitation, Guidelines Following Surgery  The results of a hip operation are greatly improved after range of motion and muscle strengthening exercises. Follow all safety measures which are given to protect your hip. If any of these exercises cause increased pain or swelling in your joint, decrease the amount until you are comfortable again. Then slowly increase the exercises. Call your caregiver if you have problems or questions.   HOME CARE INSTRUCTIONS  Remove items at home which could result in a fall. This includes throw rugs or furniture in walking pathways.  ICE to the affected hip every three hours for 30 minutes at a time and then as needed for pain and swelling.  Continue to use ice on the hip for pain and swelling from surgery. You may notice swelling that will progress down to the foot and ankle.  This is normal after surgery.  Elevate the leg when you are not up walking on it.   Continue to use the breathing machine which will help keep your temperature down.   It is common for your temperature to cycle up and down following surgery, especially at night when you are not up moving around and exerting yourself.  The breathing machine keeps your lungs expanded and your temperature down.  DIET You may resume your previous home diet once your are discharged from the hospital.  DRESSING / WOUND CARE / SHOWERING You may change your dressing 3-5 days after surgery.  Then change the dressing every day with sterile gauze.  Please use good hand washing techniques before changing the dressing.  Do not use any lotions or creams on the incision until instructed by your surgeon. You may start showering once you are discharged home but do not submerge the incision under water. Just pat the incision dry and apply a dry gauze dressing on daily. Change the surgical dressing daily and reapply a dry dressing each time.  ACTIVITY Walk with your walker as instructed. Use walker as long as suggested by your caregivers. Avoid periods of inactivity such as sitting longer than an hour when not asleep. This helps prevent blood clots.  You may resume a sexual relationship in one month or when given the  OK by your doctor.  You may return to work once you are cleared by your doctor.  Do not drive a car for 6 weeks or until released by you surgeon.  Do not drive while taking narcotics.  WEIGHT BEARING Weight bearing as tolerated with assist device (walker, cane, etc) as directed, use it as long as suggested by your surgeon or therapist, typically at least 4-6 weeks.  POSTOPERATIVE CONSTIPATION PROTOCOL Constipation - defined medically as fewer than three stools per week and severe constipation as less than one stool per week.  One of the most common issues patients have following surgery is constipation.  Even if you have a regular bowel pattern at home, your normal regimen is likely to be disrupted due to multiple reasons following surgery.  Combination of anesthesia,  postoperative narcotics, change in appetite and fluid intake all can affect your bowels.  In order to avoid complications following surgery, here are some recommendations in order to help you during your recovery period.  Colace (docusate) - Pick up an over-the-counter form of Colace or another stool softener and take twice a day as long as you are requiring postoperative pain medications.  Take with a full glass of water daily.  If you experience loose stools or diarrhea, hold the colace until you stool forms back up.  If your symptoms do not get better within 1 week or if they get worse, check with your doctor.  Dulcolax (bisacodyl) - Pick up over-the-counter and take as directed by the product packaging as needed to assist with the movement of your bowels.  Take with a full glass of water.  Use this product as needed if not relieved by Colace only.   MiraLax (polyethylene glycol) - Pick up over-the-counter to have on hand.  MiraLax is a solution that will increase the amount of water in your bowels to assist with bowel movements.  Take as directed and can mix with a glass of water, juice, soda, coffee, or tea.  Take if you go more than two days without a movement. Do not use MiraLax more than once per day. Call your doctor if you are still constipated or irregular after using this medication for 7 days in a row.  If you continue to have problems with postoperative constipation, please contact the office for further assistance and recommendations.  If you experience "the worst abdominal pain ever" or develop nausea or vomiting, please contact the office immediatly for further recommendations for treatment.  ITCHING  If you experience itching with your medications, try taking only a single pain pill, or even half a pain pill at a time.  You can also use Benadryl over the counter for itching or also to help with sleep.   TED HOSE STOCKINGS Wear the elastic stockings on both legs for three weeks  following surgery during the day but you may remove then at night for sleeping.  MEDICATIONS See your medication summary on the "After Visit Summary" that the nursing staff will review with you prior to discharge.  You may have some home medications which will be placed on hold until you complete the course of blood thinner medication.  It is important for you to complete the blood thinner medication as prescribed by your surgeon.  Continue your approved medications as instructed at time of discharge.  PRECAUTIONS If you experience chest pain or shortness of breath - call 911 immediately for transfer to the hospital emergency department.  If you develop a fever greater  that 101 F, purulent drainage from wound, increased redness or drainage from wound, foul odor from the wound/dressing, or calf pain - CONTACT YOUR SURGEON.                                                   FOLLOW-UP APPOINTMENTS Make sure you keep all of your appointments after your operation with your surgeon and caregivers. You should call the office at the above phone number and make an appointment for approximately two weeks after the date of your surgery or on the date instructed by your surgeon outlined in the "After Visit Summary".  RANGE OF MOTION AND STRENGTHENING EXERCISES  These exercises are designed to help you keep full movement of your hip joint. Follow your caregiver's or physical therapist's instructions. Perform all exercises about fifteen times, three times per day or as directed. Exercise both hips, even if you have had only one joint replacement. These exercises can be done on a training (exercise) mat, on the floor, on a table or on a bed. Use whatever works the best and is most comfortable for you. Use music or television while you are exercising so that the exercises are a pleasant break in your day. This will make your life better with the exercises acting as a break in routine you can look forward to.  Lying on  your back, slowly slide your foot toward your buttocks, raising your knee up off the floor. Then slowly slide your foot back down until your leg is straight again.  Lying on your back spread your legs as far apart as you can without causing discomfort.  Lying on your side, raise your upper leg and foot straight up from the floor as far as is comfortable. Slowly lower the leg and repeat.  Lying on your back, tighten up the muscle in the front of your thigh (quadriceps muscles). You can do this by keeping your leg straight and trying to raise your heel off the floor. This helps strengthen the largest muscle supporting your knee.  Lying on your back, tighten up the muscles of your buttocks both with the legs straight and with the knee bent at a comfortable angle while keeping your heel on the floor.   IF YOU ARE TRANSFERRED TO A SKILLED REHAB FACILITY If the patient is transferred to a skilled rehab facility following release from the hospital, a list of the current medications will be sent to the facility for the patient to continue.  When discharged from the skilled rehab facility, please have the facility set up the patient's Home Health Physical Therapy prior to being released. Also, the skilled facility will be responsible for providing the patient with their medications at time of release from the facility to include their pain medication, the muscle relaxants, and their blood thinner medication. If the patient is still at the rehab facility at time of the two week follow up appointment, the skilled rehab facility will also need to assist the patient in arranging follow up appointment in our office and any transportation needs.  MAKE SURE YOU:  Understand these instructions.  Get help right away if you are not doing well or get worse.    Pick up stool softner and laxative for home use following surgery while on pain medications. Do not submerge incision under water. Please use good  hand washing  techniques while changing dressing each day. May shower starting three days after surgery. Please use a clean towel to pat the incision dry following showers. Continue to use ice for pain and swelling after surgery. Do not use any lotions or creams on the incision until instructed by your surgeon.   Do not sit on low chairs, stoools or toilet seats, as it may be difficult to get up from low surfaces   Complete by: As directed    Driving restrictions   Complete by: As directed    No driving for two weeks   TED hose   Complete by: As directed    Use stockings (TED hose) for three weeks on both leg(s).  You may remove them at night for sleeping.   Weight bearing as tolerated   Complete by: As directed      Allergies as of 07/14/2018      Reactions   Neurontin [gabapentin] Rash   Penicillins Rash, Other (See Comments)   Has patient had a PCN reaction causing immediate rash, facial/tongue/throat swelling, SOB or lightheadedness with hypotension: Unknown Has patient had a PCN reaction causing severe rash involving mucus membranes or skin necrosis: Unknown Has patient had a PCN reaction that required hospitalization: Unknown Has patient had a PCN reaction occurring within the last 10 years: No If all of the above answers are "NO", then may proceed with Cephalosporin use.      Medication List    STOP taking these medications   Diclofenac Sodium CR 100 MG 24 hr tablet   folic acid 1 MG tablet Commonly known as: FOLVITE   methotrexate (PF) 50 MG/2ML injection   Vitamin D3 25 MCG (1000 UT) Caps     TAKE these medications   amitriptyline 100 MG tablet Commonly known as: ELAVIL Take 200 mg by mouth at bedtime.   fluticasone 50 MCG/ACT nasal spray Commonly known as: FLONASE Place 1 spray into both nostrils daily as needed for allergies or rhinitis.   LORazepam 2 MG tablet Commonly known as: ATIVAN Take 2 mg by mouth at bedtime.   losartan-hydrochlorothiazide 100-25 MG  tablet Commonly known as: HYZAAR Take 1 tablet by mouth daily.   methocarbamol 500 MG tablet Commonly known as: ROBAXIN Take 1 tablet (500 mg total) by mouth every 6 (six) hours as needed for muscle spasms.   oxyCODONE 5 MG immediate release tablet Commonly known as: Oxy IR/ROXICODONE Take 1-2 tablets (5-10 mg total) by mouth every 6 (six) hours as needed for moderate pain (pain score 4-6). What changed:   when to take this  reasons to take this   rivaroxaban 10 MG Tabs tablet Commonly known as: XARELTO Take 1 tablet (10 mg total) by mouth daily with breakfast for 20 days. Then take one 81 mg aspirin once a day for three weeks. Then discontinue aspirin.   venlafaxine XR 75 MG 24 hr capsule Commonly known as: EFFEXOR-XR Take 75 mg by mouth daily with breakfast.            Discharge Care Instructions  (From admission, onward)         Start     Ordered   07/14/18 0000  Weight bearing as tolerated     07/14/18 0806   07/14/18 0000  Change dressing    Comments: You may change your dressing on Friday, then change the dressing daily with sterile 4 x 4 inch gauze dressing and paper tape.   07/14/18 1610  Follow-up Information    Ollen Gross, MD. Schedule an appointment as soon as possible for a visit on 07/28/2018.   Specialty: Orthopedic Surgery Contact information: [address] [address] [place] [address] [phone]           Signed: Arther Abbott, PA-C Orthopedic Surgery 07/18/2018, 8:30 AM

## 2018-08-15 DIAGNOSIS — Z96641 Presence of right artificial hip joint: Secondary | ICD-10-CM | POA: Insufficient documentation

## 2018-09-15 ENCOUNTER — Encounter: Payer: Self-pay | Admitting: Podiatry

## 2018-09-15 ENCOUNTER — Ambulatory Visit (INDEPENDENT_AMBULATORY_CARE_PROVIDER_SITE_OTHER): Payer: Commercial Managed Care - PPO

## 2018-09-15 ENCOUNTER — Telehealth: Payer: Self-pay | Admitting: *Deleted

## 2018-09-15 ENCOUNTER — Other Ambulatory Visit: Payer: Self-pay

## 2018-09-15 ENCOUNTER — Ambulatory Visit: Payer: Commercial Managed Care - PPO | Admitting: Podiatry

## 2018-09-15 VITALS — Temp 97.8°F

## 2018-09-15 DIAGNOSIS — M2041 Other hammer toe(s) (acquired), right foot: Secondary | ICD-10-CM

## 2018-09-15 DIAGNOSIS — M2042 Other hammer toe(s) (acquired), left foot: Secondary | ICD-10-CM

## 2018-09-15 DIAGNOSIS — B070 Plantar wart: Secondary | ICD-10-CM

## 2018-09-15 NOTE — Patient Instructions (Signed)
Hammer Toe  Hammer toe is a change in the shape (a deformity) of your toe. The deformity causes the middle joint of your toe to stay bent. This causes pain, especially when you are wearing shoes. Hammer toe starts gradually. At first, the toe can be straightened. Gradually over time, the deformity becomes stiff and permanent. Early treatments to keep the toe straight may relieve pain. As the deformity becomes stiff and permanent, surgery may be needed to straighten the toe. What are the causes? Hammer toe is caused by abnormal bending of the toe joint that is closest to your foot. It happens gradually over time. This pulls on the muscles and connections (tendons) of the toe joint, making them weak and stiff. It is often related to wearing shoes that are too short or narrow and do not let your toes straighten. What increases the risk? You may be at greater risk for hammer toe if you:  Are male.  Are older.  Wear shoes that are too small.  Wear high-heeled shoes that pinch your toes.  Are a ballet dancer.  Have a second toe that is longer than your big toe (first toe).  Injure your foot or toe.  Have arthritis.  Have a family history of hammer toe.  Have a nerve or muscle disorder. What are the signs or symptoms? The main symptoms of this condition are pain and deformity of the toe. The pain is worse when wearing shoes, walking, or running. Other symptoms may include:  Corns or calluses over the bent part of the toe or between the toes.  Redness and a burning feeling on the toe.  An open sore that forms on the top of the toe.  Not being able to straighten the toe. How is this diagnosed? This condition is diagnosed based on your symptoms and a physical exam. During the exam, your health care provider will try to straighten your toe to see how stiff the deformity is. You may also have tests, such as:  A blood test to check for rheumatoid arthritis.  An X-ray to show how  severe the deformity is. How is this treated? Treatment for this condition will depend on how stiff the deformity is. Surgery is often needed. However, sometimes a hammer toe can be straightened without surgery. Treatments that do not involve surgery include:  Taping the toe into a straightened position.  Using pads and cushions to protect the toe (orthotics).  Wearing shoes that provide enough room for the toes.  Doing toe-stretching exercises at home.  Taking an NSAID to reduce pain and swelling. If these treatments do not help or the toe cannot be straightened, surgery is the next option. The most common surgeries used to straighten a hammer toe include:  Arthroplasty. In this procedure, part of the joint is removed, and that allows the toe to straighten.  Fusion. In this procedure, cartilage between the two bones of the joint is taken out and the bones are fused together into one longer bone.  Implantation. In this procedure, part of the bone is removed and replaced with an implant to let the toe move again.  Flexor tendon transfer. In this procedure, the tendons that curl the toes down (flexor tendons) are repositioned. Follow these instructions at home:  Take over-the-counter and prescription medicines only as told by your health care provider.  Do toe straightening and stretching exercises as told by your health care provider.  Keep all follow-up visits as told by your health care   provider. This is important. How is this prevented?  Wear shoes that give your toes enough room and do not cause pain.  Do not wear high-heeled shoes. Contact a health care provider if:  Your pain gets worse.  Your toe becomes red or swollen.  You develop an open sore on your toe. This information is not intended to replace advice given to you by your health care provider. Make sure you discuss any questions you have with your health care provider. Document Released: 01/10/2000 Document  Revised: 12/25/2016 Document Reviewed: 05/08/2015 Elsevier Patient Education  2020 Else[employer] Inc.  

## 2018-09-15 NOTE — Telephone Encounter (Signed)
"  I was in earlier today.  Dr. Paulla Dolly scheduled a Hammer Toe surgery for me.  I didn't write the date down.  I was trying to see if you can let me know what the date is."  I'm returning your call.  You are scheduled for October 06, 2018.  Be here at 7:45 am.  "Alright, thanks for returning my call."

## 2018-09-15 NOTE — Progress Notes (Signed)
Subjective:   Patient ID: Hunter Swanson, male   DOB: 58 y.o.   MRN: [id number]   HPI Patient states that he has a painful lesion on the bottom of his left foot and on his right foot the third toe is bent and irritating the second toe he walks on the end of the toe when he wants it straight.  States it is been ongoing and more more aggravation of the last few years.  Patient does not smoke likes to be active    Review of Systems  All other systems reviewed and are negative.       Objective:  Physical Exam Vitals signs and nursing note reviewed.  Constitutional:      Appearance: He is well-developed.  Pulmonary:     Effort: Pulmonary effort is normal.  Musculoskeletal: Normal range of motion.  Skin:    General: Skin is warm.  Neurological:     Mental Status: He is alert.     Neurovascular status intact muscle strength was found to be adequate range of motion within normal limits.  Patient is noted to have on the left foot a keratotic lesion subsecond metatarsal.  Upon debridement pinpoint bleeding is noted painful to lateral pressure and has multiple small areas consistent with verruca plantaris.  On the right foot the third digit distal joint is very rotated and is walking on the distal lateral aspect of the toe with keratotic lesion is very painful when pressed     Assessment:  Verruca plantaris left plantar foot and hammertoe deformity third right     Plan:  H&P reviewed both conditions and today I debrided the left plantar lesion exposed the area and applied chemical agent to create immune response with sterile dressing.  Explained what to do if any blistering were to occur.  The right patient wants surgery and not adjusting any other treatment and I discussed derotational distal arthroplasty and this is what he wants.  I explained procedure risk and is willing to accept risk and after extensive review of consent form he signed understanding alternative treatments  complications.  Scheduled for surgery and we will do this in the office as he did not want to do the surgical center any schedule II weeks from now and I reviewed the preoperative considerations for surgery and encouraged him to call with any questions prior to the procedure which may come up  X-rays indicate severe distal rotation digit 3 right against the second toe with minimal rotation of the third digit left foot

## 2018-09-15 NOTE — Progress Notes (Signed)
   Subjective:    Patient ID: Hunter Swanson, male    DOB: 1960-04-21, 58 y.o.   MRN: [id number]  HPI    Review of Systems  All other systems reviewed and are negative.      Objective:   Physical Exam        Assessment & Plan:

## 2018-10-05 ENCOUNTER — Telehealth: Payer: Self-pay | Admitting: *Deleted

## 2018-10-05 NOTE — Telephone Encounter (Signed)
DOS 10/06/2018 HAMMER TOE REPAIR 3RD DISTAL  RT - 28285  UMR: Eligibility Date - 04/27/2018  Benefit percentage  80%Plan pays  20%You pay  Individual deductible  $0.00 to go     $2,000.00 out of $2,000.00   Individual integrated out-of-pocket  $0.00 to go     $6,000.00 out of $6,000.00  *Applied amount includes $384.11 for prescriptions  Individual annual maximum     Precert Requirements: Required for OUTPATIENT SURGERIES  I called [employer] to get authorization.  I spoke to [place].  She stated due to the surgery being performed in the physician office authorization is NOT REQUIRED.    Call Ref. # [id number]

## 2018-10-06 ENCOUNTER — Ambulatory Visit (INDEPENDENT_AMBULATORY_CARE_PROVIDER_SITE_OTHER): Payer: Commercial Managed Care - PPO | Admitting: Podiatry

## 2018-10-06 ENCOUNTER — Encounter: Payer: Self-pay | Admitting: Podiatry

## 2018-10-06 ENCOUNTER — Other Ambulatory Visit: Payer: Self-pay

## 2018-10-06 VITALS — BP 185/88 | Temp 97.5°F | Resp 16

## 2018-10-06 DIAGNOSIS — M2042 Other hammer toe(s) (acquired), left foot: Secondary | ICD-10-CM

## 2018-10-06 DIAGNOSIS — M2041 Other hammer toe(s) (acquired), right foot: Secondary | ICD-10-CM | POA: Diagnosis not present

## 2018-10-06 NOTE — Progress Notes (Signed)
Subjective:   Patient ID: Hunter Swanson, male   DOB: 58 y.o.   MRN: [id number]   HPI Patient presents with chronic discomfort in the third digit right foot stating that padding shoe gear modifications and not giving him relief   ROS      Objective:  Physical Exam  Neurovascular status intact good digital perfusion noted with severe distal rotation third digit right with distal lateral keratotic lesion that is very painful and also was found to have lesions plantar left consistent with warts that responded well to the first chemical application     Assessment:  Hammertoe third right verruca plantaris left     Plan:  H&P done and patient was anesthetized with 60 mg Xylocaine Marcaine mixture.  Patient was brought to the OR placed in supine position the OR table.  The patient was injected with another 60 mg Xylocaine at this time and the patient's right foot was prepped and draped utilizing standard aseptic technique.  The right foot was examined utilizing Ace wrap and the right ankle tourniquet was inflated 250 mmHg and the following procedure was performed.  Distal derotational arthroplasty digit 3 right.  Attention was directed to the dorsal aspect digit 3 right where a semi-elliptical teardrop incision was made so as to reduce the rotated toe.  The intervening skin wedge was removed at the distal interphalangeal joint and the medial lateral collateral ligaments at the distal interphalangeal joint were severed.  The extensor expansion was opened and the head of the middle phalanx was exposed and removed in toto with a power saw.  The wound was flushed with copious amounts Garamycin solution and the digit was placed in a derotated position and was sutured with 4-0 nylon with a combination of horizontal and simple interrupted suture.  The patient's right foot then had sterile dressing applied and the right ankle tourniquet was deflated with capillary fill noted after several minutes with  complete adequate blood flow showing to the third digit.  Patient left the OR in satisfactory condition was given all postoperative instructions and will be seen back office 1 week and was encouraged to call with any questions concerns which may come up

## 2018-10-07 ENCOUNTER — Telehealth: Payer: Self-pay | Admitting: *Deleted

## 2018-10-07 NOTE — Telephone Encounter (Signed)
Pt called states he had surgery yesterday and got the foot wet in the shower, they removed the dressing and redressed.

## 2018-10-07 NOTE — Telephone Encounter (Signed)
I informed Dr. Paulla Dolly pt had gotten the surgical dressing wet today in the shower and redressed. Dr. Paulla Dolly states that should be fine as long as the dressing is sterile and dry, should refrain from showering and take sink bathes in the future. Left message informing pt of Dr. Mellody Drown instructions.

## 2018-10-12 ENCOUNTER — Ambulatory Visit (INDEPENDENT_AMBULATORY_CARE_PROVIDER_SITE_OTHER): Payer: Commercial Managed Care - PPO

## 2018-10-12 ENCOUNTER — Ambulatory Visit (INDEPENDENT_AMBULATORY_CARE_PROVIDER_SITE_OTHER): Payer: Self-pay | Admitting: Podiatry

## 2018-10-12 ENCOUNTER — Encounter: Payer: Self-pay | Admitting: Podiatry

## 2018-10-12 ENCOUNTER — Other Ambulatory Visit: Payer: Self-pay

## 2018-10-12 DIAGNOSIS — Z09 Encounter for follow-up examination after completed treatment for conditions other than malignant neoplasm: Secondary | ICD-10-CM

## 2018-10-12 DIAGNOSIS — M2042 Other hammer toe(s) (acquired), left foot: Secondary | ICD-10-CM

## 2018-10-12 DIAGNOSIS — M2041 Other hammer toe(s) (acquired), right foot: Secondary | ICD-10-CM

## 2018-10-17 ENCOUNTER — Other Ambulatory Visit: Payer: Self-pay | Admitting: Podiatry

## 2018-10-17 MED ORDER — DOXYCYCLINE HYCLATE 100 MG PO TABS: 100.0000 mg | ORAL_TABLET | Freq: Two times a day (BID) | ORAL | 0 refills | Status: DC

## 2018-10-17 NOTE — Progress Notes (Signed)
Called and stated post-op toe swollen and red, slight drainage. Denies N/V/F/Ch signs of infection. Rx Doxycycyline will have patient f/u in the office tomorrow for check.

## 2018-10-18 ENCOUNTER — Ambulatory Visit (INDEPENDENT_AMBULATORY_CARE_PROVIDER_SITE_OTHER): Payer: Commercial Managed Care - PPO | Admitting: Podiatry

## 2018-10-18 ENCOUNTER — Encounter: Payer: Self-pay | Admitting: Podiatry

## 2018-10-18 ENCOUNTER — Ambulatory Visit (INDEPENDENT_AMBULATORY_CARE_PROVIDER_SITE_OTHER): Payer: Commercial Managed Care - PPO

## 2018-10-18 ENCOUNTER — Other Ambulatory Visit: Payer: Self-pay | Admitting: Podiatry

## 2018-10-18 ENCOUNTER — Other Ambulatory Visit: Payer: Self-pay

## 2018-10-18 ENCOUNTER — Ambulatory Visit: Payer: Commercial Managed Care - PPO

## 2018-10-18 DIAGNOSIS — L03031 Cellulitis of right toe: Secondary | ICD-10-CM | POA: Diagnosis not present

## 2018-10-18 DIAGNOSIS — L02611 Cutaneous abscess of right foot: Secondary | ICD-10-CM

## 2018-10-18 DIAGNOSIS — M2042 Other hammer toe(s) (acquired), left foot: Secondary | ICD-10-CM

## 2018-10-18 DIAGNOSIS — M2041 Other hammer toe(s) (acquired), right foot: Secondary | ICD-10-CM

## 2018-10-18 NOTE — Progress Notes (Signed)
Subjective:   Patient ID: Hunter Swanson, male   DOB: 58 y.o.   MRN: [id number]   HPI Patient states he is feeling much better but he admits that he has been very rough on his toe and is not listened to the advice that we have tried to give him   ROS      Objective:  Physical Exam  Neurovascular status intact with third digit right incision site healing with wound edges well coapted but patient has been very aggressive on his foot and there is swelling     Assessment:  Overall patient is doing well but he has been very aggressive and not been compliant with his healing process     Plan:  Reviewed the importance of continued immobilization elevation and reapplied sterile dressing.  Reappoint in approximately 1 week or earlier if any issues were to occur and patient promises he will take better care of it during that time  X-rays indicate satisfactory resection of bone with good alignment noted

## 2018-10-18 NOTE — Progress Notes (Signed)
Subjective:   Patient ID: Hunter Swanson, male   DOB: 58 y.o.   MRN: [id number]   HPI Patient presents stating that he tried to wear shoe Thursday and Friday and that his toe has become red and he called and was put on antibiotic but he needed to have it checked   ROS      Objective:  Physical Exam  Neurovascular status intact with patient's third digit right showing a small amount of gapping at the distal phalangeal joint as patient has been noncompliant and wear regular shoe creating irritation with light redness around the metatarsal that is localized with no proximal edema erythema drainage noted or no drainage coming from the incision site.  It is moderately swollen     Assessment:  Noncompliant patient who wore shoe creating irritation of the digit with probable localized infection with no current drainage     Plan:  H&P reviewed condition and he is on an antibiotic but did not pick it up last night and I gave him strong instructions that he must pick it up and start his antibiotic today and that he has not been compliant he must be compliant not wear any type of shoe gear and I did apply sterile dressing but and gave him strict instructions that if any redness were to occur any systemic signs of infection were to occur as he is to go immediately to the emergency room and may require IV antibiotics and to contact our office immediately

## 2018-10-20 ENCOUNTER — Telehealth: Payer: Self-pay | Admitting: *Deleted

## 2018-10-20 ENCOUNTER — Ambulatory Visit (INDEPENDENT_AMBULATORY_CARE_PROVIDER_SITE_OTHER): Payer: Commercial Managed Care - PPO | Admitting: Podiatry

## 2018-10-20 ENCOUNTER — Other Ambulatory Visit: Payer: Self-pay

## 2018-10-20 VITALS — Temp 98.3°F

## 2018-10-20 DIAGNOSIS — M86679 Other chronic osteomyelitis, unspecified ankle and foot: Secondary | ICD-10-CM

## 2018-10-20 DIAGNOSIS — L02611 Cutaneous abscess of right foot: Secondary | ICD-10-CM

## 2018-10-20 DIAGNOSIS — L03031 Cellulitis of right toe: Secondary | ICD-10-CM

## 2018-10-20 MED ORDER — OXYCODONE-ACETAMINOPHEN 5-325 MG PO TABS: 1.0000 | ORAL_TABLET | ORAL | 0 refills | Status: DC | PRN

## 2018-10-20 MED ORDER — CIPROFLOXACIN HCL 250 MG PO TABS: 250.0000 mg | ORAL_TABLET | Freq: Two times a day (BID) | ORAL | 0 refills | Status: DC

## 2018-10-20 MED ORDER — CLINDAMYCIN HCL 300 MG PO CAPS: 300.0000 mg | ORAL_CAPSULE | Freq: Two times a day (BID) | ORAL | 0 refills | Status: DC

## 2018-10-20 NOTE — Patient Instructions (Signed)
Pre-Operative Instructions  Congratulations, you have decided to take an important step towards improving your quality of life.  You can be assured that the doctors and staff at [facility] will be with you every step of the way.  Here are some important things you should know:  1. Plan to be at the surgery center/hospital at least 1 (one) hour prior to your scheduled time, unless otherwise directed by the surgical center/hospital staff.  You must have a responsible adult accompany you, remain during the surgery and drive you home.  Make sure you have directions to the surgical center/hospital to ensure you arrive on time. 2. If you are having surgery at [facility] or [facility], you will need a copy of your medical history and physical form from your family physician within one month prior to the date of surgery. We will give you a form for your primary physician to complete.  3. We make every effort to accommodate the date you request for surgery.  However, there are times where surgery dates or times have to be moved.  We will contact you as soon as possible if a change in schedule is required.   4. No aspirin/ibuprofen for one week before surgery.  If you are on aspirin, any non-steroidal anti-inflammatory medications (Mobic, Aleve, Ibuprofen) should not be taken seven (7) days prior to your surgery.  You make take Tylenol for pain prior to surgery.  5. Medications - If you are taking daily heart and blood pressure medications, seizure, reflux, allergy, asthma, anxiety, pain or diabetes medications, make sure you notify the surgery center/hospital before the day of surgery so they can tell you which medications you should take or avoid the day of surgery. 6. No food or drink after midnight the night before surgery unless directed otherwise by surgical center/hospital staff. 7. No alcoholic beverages 24-hours prior to surgery.  No smoking 24-hours prior or 24-hours after  surgery. 8. Wear loose pants or shorts. They should be loose enough to fit over bandages, boots, and casts. 9. Don't wear slip-on shoes. Sneakers are preferred. 10. Bring your boot with you to the surgery center/hospital.  Also bring crutches or a walker if your physician has prescribed it for you.  If you do not have this equipment, it will be provided for you after surgery. 11. If you have not been contacted by the surgery center/hospital by the day before your surgery, call to confirm the date and time of your surgery. 12. Leave-time from work may vary depending on the type of surgery you have.  Appropriate arrangements should be made prior to surgery with your employer. 13. Prescriptions will be provided immediately following surgery by your doctor.  Fill these as soon as possible after surgery and take the medication as directed. Pain medications will not be refilled on weekends and must be approved by the doctor. 14. Remove nail polish on the operative foot and avoid getting pedicures prior to surgery. 15. Wash the night before surgery.  The night before surgery wash the foot and leg well with water and the antibacterial soap provided. Be sure to pay special attention to beneath the toenails and in between the toes.  Wash for at least three (3) minutes. Rinse thoroughly with water and dry well with a towel.  Perform this wash unless told not to do so by your physician.  Enclosed: 1 Ice pack (please put in freezer the night before surgery)   1 Hibiclens skin cleaner     Pre-op instructions  If you have any questions regarding the instructions, please do not hesitate to call our office.  [place]: [address], [place], [place] [address] -- [phone]  [place]: [address] [place], [place] [address] -- [phone]  [place]: [address]  [place], [place] [address] -- [phone]   Website: [email or web address] 

## 2018-10-20 NOTE — Telephone Encounter (Signed)
I left the patient a message that we have him scheduled for today for his procedure.  I informed him that he should hear from someone from the surgical center in regards to his arrival time.  I told him if he has not heard from them, call the appointment scheduler at the surgical center to inquire at [phone].

## 2018-10-20 NOTE — Telephone Encounter (Signed)
DOS 10/20/2018 WOUND DEBRIDEMENT -          , BONE BIOPSY -     ,   UMR: Eligibility Date - 04/27/2018  Benefit percentage  80%Plan pays  20%You pay  Individual deductible  $0.00 to go     $2,000.00 out of $2,000.00   Individual integrated out-of-pocket  $0.00 to go     $6,000.00 out of $6,000.00  *Applied amount includes $384.11 for prescriptions   Pre-certification is REQUIRED. Authorization # - [id number]  I called Caren Griffins at [facility] and gave her the authorization number.

## 2018-10-21 ENCOUNTER — Telehealth: Payer: Self-pay

## 2018-10-21 ENCOUNTER — Encounter: Payer: Self-pay | Admitting: Podiatry

## 2018-10-21 NOTE — Telephone Encounter (Signed)
POST OP CALL-    1) General condition stated by the patient: Good  2) Is the pt having pain? Not much  3) Pain score:   4) Has the pt taken Rx'd pain medication, regularly or PRN?   5) Is the pain medication giving relief?  6) Any fever, chills, nausea, or vomiting, shortness of breath or tightness in calf? None  7) Is the bandage clean, dry and intact? Yes  8) Is there excessive tightness, bleeding or drainage coming through the bandage? No  9) Did you understand all of the post op instruction sheet given? Yes  10) Any questions or concerns regarding post op care/recovery? No    Confirmed POV appointment with patient   Pt had questions regarding his next post OP appointment. Pt was informed he would see Dr. Paulla Dolly on 9/30 and that the office would call him today to give him a time and get scheduled.

## 2018-10-23 LAB — WOUND CULTURE
MICRO NUMBER:: 918673
SPECIMEN QUALITY:: ADEQUATE

## 2018-10-23 NOTE — Progress Notes (Signed)
Subjective:  Patient ID: Hunter Swanson, male    DOB: 1960-05-24,  MRN: [id number]  58 y.o. male returns for post-op check.  Saw Dr. Charlsie Merles on Tuesday states that the toe has worsening redness and pain.  Has been doing antibiotic but they have not resolve the issue.  States that the area now burst open and has pus draining from it.  Review of Systems: Negative except as noted in the HPI. Denies N/V/F/Ch.  Past Medical History:  Diagnosis Date  . Anxiety   . Baker's cyst of knee, left    Small  . Chronic pain of left knee   . History of retinal detachment    right eye s/p repair 03-18-2016  . Hypertension   . 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 OA (osteoarthritis)    knees, hips  . Sleep apnea    use CPAP  . Spinal stenosis, lumbar   . Wears contact lenses     Current Outpatient Medications:  .  amitriptyline (ELAVIL) 100 MG tablet, Take 200 mg by mouth at bedtime., Disp: , Rfl:  .  Diclofenac Sodium CR 100 MG 24 hr tablet, TK 1 T PO QD, Disp: , Rfl:  .  doxycycline (VIBRA-TABS) 100 MG tablet, Take 1 tablet (100 mg total) by mouth 2 (two) times daily., Disp: 14 tablet, Rfl: 0 .  fluticasone (FLONASE) 50 MCG/ACT nasal spray, Place 1 spray into both nostrils daily as needed for allergies or rhinitis., Disp: , Rfl:  .  LORazepam (ATIVAN) 1 MG tablet, TK 1 T PO TID AS NEEDED. DO NOT MX WITH ALCOHOL OR DRIVE AFTER U, Disp: , Rfl:  .  LORazepam (ATIVAN) 2 MG tablet, Take 2 mg by mouth at bedtime., Disp: , Rfl:  .  losartan-hydrochlorothiazide (HYZAAR) 100-25 MG tablet, Take 1 tablet by mouth daily., Disp: , Rfl:  .  methocarbamol (ROBAXIN) 500 MG tablet, Take 1 tablet (500 mg total) by mouth every 6 (six) hours as needed for muscle spasms., Disp: 40 tablet, Rfl: 0 .  methotrexate 50 MG/2ML injection, ADM 1.5 ML [place] WEEKLY, Disp: , Rfl:  .  venlafaxine XR (EFFEXOR-XR) 75 MG 24 hr capsule, Take 75 mg by mouth daily with breakfast., Disp: , Rfl:  .  ciprofloxacin (CIPRO) 250 MG  tablet, Take 1 tablet (250 mg total) by mouth 2 (two) times daily., Disp: 14 tablet, Rfl: 0 .  clindamycin (CLEOCIN) 300 MG capsule, Take 1 capsule (300 mg total) by mouth 2 (two) times daily., Disp: 14 capsule, Rfl: 0 .  oxyCODONE-acetaminophen (PERCOCET) 5-325 MG tablet, Take 1 tablet by mouth every 4 (four) hours as needed for severe pain., Disp: 20 tablet, Rfl: 0  Social History   Tobacco Use  Smoking Status Never Smoker  Smokeless Tobacco Never Used    Allergies  Allergen Reactions  . Neurontin [Gabapentin] Rash  . Penicillins Rash and Other (See Comments)    Has patient had a PCN reaction causing immediate rash, facial/tongue/throat swelling, SOB or lightheadedness with hypotension: Unknown Has patient had a PCN reaction causing severe rash involving mucus membranes or skin necrosis: Unknown Has patient had a PCN reaction that required hospitalization: Unknown Has patient had a PCN reaction occurring within the last 10 years: No If all of the above answers are "NO", then may proceed with Cephalosporin use.    Objective:   Vitals:   10/20/18 0828  Temp: 98.3 F (36.8 C)   There is no height or weight on  file to calculate BMI. Constitutional Well developed. Well nourished.  Vascular Foot warm and well perfused. Capillary refill normal to all digits.   Neurologic Normal speech. Oriented to person, place, and time. Epicritic sensation to light touch grossly present bilaterally.  Dermatologic Right third toe with warmth erythema extending past the MPJ.  Dehiscence noted about the surgical site.  Purulence expressible from deep within the incision.   Orthopedic: Tenderness to palpation noted about the surgical site.   Radiographs: None Assessment:   1. Cellulitis and abscess of toe of right foot    Plan:  Patient was evaluated and treated and all questions answered.  S/p foot surgery right; right 3rd toe cellulitis, small deep abscess -Progressing as expected  post-operatively. -XR: none today. -WB Status: WBAT in surgical shoe -Sutures: intact but with dehiscence noted.  Wound culture taken -Discussed with patient that due to the deep infection of the joint recalcitrant to p.o. antibiotics he would benefit from surgical debridement and repair of the dehiscence.  Given the depth of the purulence and likely exposure to the bone will plan for bone biopsy. Patient has not eaten today and will try to add him on for today if possible.  -Medications: Extend abx to Clinda/Cipro until culture comes back. -Foot redressed.  Betadine wet-to-dry applied -Patient has failed all conservative therapy and wishes to proceed with surgical intervention. All risks, benefits, and alternatives discussed with patient. No guarantees given. Consent reviewed and signed by patient. -Planned procedures: Right third toe debridement irrigation repair wound dehiscence  Patient will continue follow-up with Dr. Paulla Dolly postoperatively  No follow-ups on file.

## 2018-10-24 ENCOUNTER — Ambulatory Visit (INDEPENDENT_AMBULATORY_CARE_PROVIDER_SITE_OTHER): Payer: Commercial Managed Care - PPO | Admitting: Podiatry

## 2018-10-24 ENCOUNTER — Encounter: Payer: Self-pay | Admitting: Podiatry

## 2018-10-24 ENCOUNTER — Other Ambulatory Visit: Payer: Self-pay

## 2018-10-24 VITALS — BP 143/77 | HR 96 | Temp 97.5°F | Resp 16

## 2018-10-24 DIAGNOSIS — L02611 Cutaneous abscess of right foot: Secondary | ICD-10-CM

## 2018-10-24 DIAGNOSIS — B070 Plantar wart: Secondary | ICD-10-CM | POA: Diagnosis not present

## 2018-10-24 DIAGNOSIS — L03031 Cellulitis of right toe: Secondary | ICD-10-CM

## 2018-10-24 NOTE — Progress Notes (Signed)
Subjective:   Patient ID: Hunter Swanson, male   DOB: 58 y.o.   MRN: [id number]   HPI Patient states I am feeling a lot better with minimal discomfort and my left foot I still have that work that can be bothersome.  States he is kept the dressing on and is been much more compliant than previous and has been wearing his open toed shoes and not wearing regular shoes as he had chosen to do prior to creating the problem he has   ROS      Objective:  Physical Exam  Neurovascular status intact with patient's right third digit healing well with no drainage noted currently and mild swelling but no current redness or no current redness in the dorsum of the foot.  Small lesion plantar left that has pinpoint bleeding and black little spots     Assessment:  Doing well post infection of the right third digit with pathology indicating staph that is sensitive to the antibiotic he is on with work formation left and no indications of proximal spread of infection     Plan:  H&P reviewed condition reapplied sterile dressing.  I did discuss that that still possible that he has infection of the bone and is possible in the end he may require distal amputation and he understands this completely and at this point we will keep it on antibiotics I reapplied sterile dressing continue open toed shoes and will be seen back Friday.  For the left I debrided the lesion applied medication to create immune response with sterile dressing and patient tolerated that well.  Strong encouragement to let us know if any issues were to occur or systemic signs of infection

## 2018-10-26 ENCOUNTER — Encounter: Payer: Self-pay | Admitting: Podiatry

## 2018-10-26 ENCOUNTER — Ambulatory Visit (INDEPENDENT_AMBULATORY_CARE_PROVIDER_SITE_OTHER): Payer: Commercial Managed Care - PPO

## 2018-10-26 ENCOUNTER — Ambulatory Visit (INDEPENDENT_AMBULATORY_CARE_PROVIDER_SITE_OTHER): Payer: Self-pay | Admitting: Podiatry

## 2018-10-26 ENCOUNTER — Other Ambulatory Visit: Payer: Self-pay

## 2018-10-26 DIAGNOSIS — Z09 Encounter for follow-up examination after completed treatment for conditions other than malignant neoplasm: Secondary | ICD-10-CM

## 2018-10-26 DIAGNOSIS — M2041 Other hammer toe(s) (acquired), right foot: Secondary | ICD-10-CM | POA: Diagnosis not present

## 2018-10-26 NOTE — Progress Notes (Signed)
Subjective:   Patient ID: Hunter Swanson, male   DOB: 58 y.o.   MRN: [id number]   HPI Patient states I am feeling much better and still taking antibiotics and the swelling and redness is gone down consistently   ROS      Objective:  Physical Exam  Neurovascular status intact with patient's digit right looking much better with wound edges well coapted no current drainage or no redness or swelling noted currently     Assessment:  Infection right third digit with patient having been noncompliant wearing shoes before he was supposed to with revisional procedure and bone biopsy     Plan:  H&P reviewed condition and stated I have not yet seen the results of the bone biopsy.  I did discuss the possibility for bone infection and that it may require further bone resection amputation or possible IV antibiotic therapy and at this point everything looks excellent but we can have to keep a close eye on it and we do not know what may occur in the future.  I reapplied sterile dressing continue with the same positional element to the toe and patient will see Dr. March Rummage who did his procedure last week in 2 days and was given strict instructions to contact us if any other issues were to occur and also continue antibiotics until seen again  X-rays indicate that I do not see ostial lysis but still possible for bone infection or other current pathology with resection of bone noted

## 2018-10-28 ENCOUNTER — Encounter: Payer: Self-pay | Admitting: Podiatry

## 2018-10-28 ENCOUNTER — Ambulatory Visit (INDEPENDENT_AMBULATORY_CARE_PROVIDER_SITE_OTHER): Payer: Commercial Managed Care - PPO | Admitting: Podiatry

## 2018-10-28 ENCOUNTER — Other Ambulatory Visit: Payer: Self-pay

## 2018-10-28 VITALS — Temp 98.2°F

## 2018-10-28 DIAGNOSIS — M86171 Other acute osteomyelitis, right ankle and foot: Secondary | ICD-10-CM

## 2018-10-28 DIAGNOSIS — Z09 Encounter for follow-up examination after completed treatment for conditions other than malignant neoplasm: Secondary | ICD-10-CM

## 2018-10-28 DIAGNOSIS — M2041 Other hammer toe(s) (acquired), right foot: Secondary | ICD-10-CM

## 2018-10-28 MED ORDER — LEVOFLOXACIN 500 MG PO TABS: 500.0000 mg | ORAL_TABLET | Freq: Every day | ORAL | 0 refills | Status: DC

## 2018-11-04 ENCOUNTER — Telehealth: Payer: Self-pay | Admitting: *Deleted

## 2018-11-04 NOTE — Telephone Encounter (Signed)
Called patient back at [phone] regarding whether they could put a regular shoe on instead of wearing their surgical shoe from surgery. Patient wanted to switch to their normal shoe because they are feeling better and wearing the surgical shoe makes them uncomfortable and it is not easy to walk in.  DOS 10/06/18 Hammertoe Repair 3rd Distal RT DOS 10/20/18 Wound debridement RT  I consulted with Dr. Posey Pronto since Dr. Paulla Dolly had left for the day and he recommended patient to wait until his next visit with doctor to see when they can switch out of their shoe.  I told patient to continue to wear their surgical shoe until they have their next visit, which is on:  Tuesday, November 08, 2018 8:00 am  Patient is on the nurse schedule and is scheduled for suture removal. I told patient he can ask about when he can wear his regular shoe again next week.  Patient stated they understood.

## 2018-11-08 ENCOUNTER — Ambulatory Visit (INDEPENDENT_AMBULATORY_CARE_PROVIDER_SITE_OTHER): Payer: Commercial Managed Care - PPO

## 2018-11-08 ENCOUNTER — Encounter: Payer: Self-pay | Admitting: Podiatry

## 2018-11-08 ENCOUNTER — Other Ambulatory Visit: Payer: Self-pay

## 2018-11-08 ENCOUNTER — Ambulatory Visit (INDEPENDENT_AMBULATORY_CARE_PROVIDER_SITE_OTHER): Payer: Self-pay | Admitting: Podiatry

## 2018-11-08 DIAGNOSIS — M2041 Other hammer toe(s) (acquired), right foot: Secondary | ICD-10-CM

## 2018-11-08 DIAGNOSIS — Z09 Encounter for follow-up examination after completed treatment for conditions other than malignant neoplasm: Secondary | ICD-10-CM

## 2018-11-08 NOTE — Progress Notes (Signed)
Subjective:  Patient ID: Hunter Swanson, male    DOB: 1960/03/28,  MRN: [id number]  Chief Complaint  Patient presents with  . Routine Post Op    pt is doing a lot better since he was last here, pt has been taking antibiotics,as well as keeping it dry     58 y.o. male returns for post-op check.  Patient states that he is doing well.  No pain to the hammertoe.  He wants to know if he can take off his shoes and going to regular shoes.  The redness and the wound on the dorsal aspect of the digit has improved considerably.  He is here today for suture removal.  Review of Systems: Negative except as noted in the HPI. Denies N/V/F/Ch.  Past Medical History:  Diagnosis Date  . Anxiety   . Baker's cyst of knee, left    Small  . Chronic pain of left knee   . History of retinal detachment    right eye s/p repair 03-18-2016  . Hypertension   . 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 OA (osteoarthritis)    knees, hips  . Sleep apnea    use CPAP  . Spinal stenosis, lumbar   . Wears contact lenses     Current Outpatient Medications:  .  amitriptyline (ELAVIL) 100 MG tablet, Take 200 mg by mouth at bedtime., Disp: , Rfl:  .  ciprofloxacin (CIPRO) 250 MG tablet, Take 1 tablet (250 mg total) by mouth 2 (two) times daily., Disp: 14 tablet, Rfl: 0 .  clindamycin (CLEOCIN) 300 MG capsule, Take 1 capsule (300 mg total) by mouth 2 (two) times daily., Disp: 14 capsule, Rfl: 0 .  Diclofenac Sodium CR 100 MG 24 hr tablet, TK 1 T PO QD, Disp: , Rfl:  .  fluticasone (FLONASE) 50 MCG/ACT nasal spray, Place 1 spray into both nostrils daily as needed for allergies or rhinitis., Disp: , Rfl:  .  levofloxacin (LEVAQUIN) 500 MG tablet, Take 1 tablet (500 mg total) by mouth daily., Disp: 28 tablet, Rfl: 0 .  LORazepam (ATIVAN) 1 MG tablet, TK 1 T PO TID AS NEEDED. DO NOT MX WITH ALCOHOL OR DRIVE AFTER U, Disp: , Rfl:  .  LORazepam (ATIVAN) 2 MG tablet, Take 2 mg by mouth at bedtime., Disp: , Rfl:  .   losartan-hydrochlorothiazide (HYZAAR) 100-25 MG tablet, Take 1 tablet by mouth daily., Disp: , Rfl:  .  methocarbamol (ROBAXIN) 500 MG tablet, Take 1 tablet (500 mg total) by mouth every 6 (six) hours as needed for muscle spasms., Disp: 40 tablet, Rfl: 0 .  methotrexate 50 MG/2ML injection, ADM 1.5 ML [place] WEEKLY, Disp: , Rfl:  .  oxyCODONE-acetaminophen (PERCOCET) 5-325 MG tablet, Take 1 tablet by mouth every 4 (four) hours as needed for severe pain., Disp: 20 tablet, Rfl: 0 .  venlafaxine XR (EFFEXOR-XR) 75 MG 24 hr capsule, Take 75 mg by mouth daily with breakfast., Disp: , Rfl:   Social History   Tobacco Use  Smoking Status Never Smoker  Smokeless Tobacco Never Used    Allergies  Allergen Reactions  . Neurontin [Gabapentin] Rash  . Penicillins Rash and Other (See Comments)    Has patient had a PCN reaction causing immediate rash, facial/tongue/throat swelling, SOB or lightheadedness with hypotension: Unknown Has patient had a PCN reaction causing severe rash involving mucus membranes or skin necrosis: Unknown Has patient had a PCN reaction that required hospitalization: Unknown Has patient had  a PCN reaction occurring within the last 10 years: No If all of the above answers are "NO", then may proceed with Cephalosporin use.    Objective:  There were no vitals filed for this visit. There is no height or weight on file to calculate BMI. Constitutional Well developed. Well nourished.  Vascular Foot warm and well perfused. Capillary refill normal to all digits.   Neurologic Normal speech. Oriented to person, place, and time. Epicritic sensation to light touch grossly present bilaterally.  Dermatologic Skin healing well without signs of infection. Skin edges well coapted without signs of infection.  Orthopedic: Tenderness to palpation noted about the surgical site.   Radiographs: There is an  slight increase in soft tissue density of the right foot.  The correction is in a good  position with good alignment.  No other bony abnormalities noted. Assessment:   1. Hammer toe of right foot    Plan:  Patient was evaluated and treated and all questions answered.  S/p foot surgery right third digit hammertoe -Progressing as expected post-operatively. -XR: See above -WB Status: As tolerated in surgical shoe -Sutures: Sutures were removed today. -Medications: None -Foot redressed with triple antibiotic to a small dorsal wound and a Band-Aid.  I instructed the patient to do the same and will be seen by Dr. Samuella Cota in 2 weeks  No follow-ups on file.

## 2018-11-24 ENCOUNTER — Ambulatory Visit: Payer: Commercial Managed Care - PPO | Admitting: Podiatry

## 2019-08-06 NOTE — Progress Notes (Signed)
  Subjective:  Patient ID: MIRZA FESSEL, male    DOB: 1960-08-22,  MRN: [id number]  Chief Complaint  Patient presents with  . Routine Post Op    " my foot is feeling pretty good, I have finished my antibiotics"     DOS: 10/20/18 Procedure: Incision and drainage right third toe with debridement, bone biopsy, wound closure  59 y.o. male presents with the above complaint. History confirmed with patient.   Objective:  Physical Exam: tenderness at the surgical site, local edema noted and calf supple, nontender. Incision: healing well, no significant drainage, no dehiscence, no significant erythema  Assessment:   1. Hammer toe of right foot   2. Surgery follow-up   3. Acute osteomyelitis of toe, right (HCC)     Plan:  Patient was evaluated and treated and all questions answered.  Post-operative State -Ok to start showering at this time. Advised they cannot soak. -WBAT in CAM boot  -Cultures reviewed. Rx levaquin  No follow-ups on file.

## 2020-02-27 ENCOUNTER — Other Ambulatory Visit: Payer: Self-pay

## 2020-02-27 ENCOUNTER — Ambulatory Visit
Admission: RE | Admit: 2020-02-27 | Discharge: 2020-02-27 | Disposition: A | Discharge status: Home / Self Care | Payer: Commercial Managed Care - PPO | Source: Ambulatory Visit | Attending: Internal Medicine | Admitting: Internal Medicine

## 2020-02-27 ENCOUNTER — Ambulatory Visit (INDEPENDENT_AMBULATORY_CARE_PROVIDER_SITE_OTHER): Payer: Commercial Managed Care - PPO

## 2020-02-27 VITALS — BP 149/80 | HR 110 | Temp 99.0°F | Resp 18 | Ht 75.0 in | Wt 290.0 lb

## 2020-02-27 DIAGNOSIS — Z20822 Contact with and (suspected) exposure to covid-19: Secondary | ICD-10-CM | POA: Diagnosis not present

## 2020-02-27 DIAGNOSIS — J189 Pneumonia, unspecified organism: Secondary | ICD-10-CM

## 2020-02-27 MED ORDER — LEVOFLOXACIN 500 MG PO TABS: 500.0000 mg | ORAL_TABLET | Freq: Every day | ORAL | 0 refills | Status: DC

## 2020-02-27 MED ORDER — ALBUTEROL SULFATE HFA 108 (90 BASE) MCG/ACT IN AERS: 2.0000 | INHALATION_SPRAY | RESPIRATORY_TRACT | 0 refills | Status: DC | PRN

## 2020-02-27 MED ORDER — BENZONATATE 200 MG PO CAPS: 200.0000 mg | ORAL_CAPSULE | Freq: Three times a day (TID) | ORAL | 0 refills | Status: DC | PRN

## 2020-02-27 NOTE — ED Provider Notes (Signed)
Hunter Swanson    CSN: [id number] Arrival date & time: 02/27/20  1006      History   Chief Complaint Chief Complaint  Patient presents with  . Shortness of Breath    HPI Hunter Swanson is a 60 y.o. male who presents with SOB x 3 days. Has been having productive cough, congestion, rhinitis, which started 2 weeks ago. Had a fever of 102.9 last night. Had Positive covid on 01/30/20 and his repeat rapid test was neg on 1/7.  His cough is productive with green sputum.    Past Medical History:  Diagnosis Date  . Anxiety   . Baker's cyst of knee, left    Small  . Chronic pain of left knee   . History of retinal detachment    right eye s/p repair 03-18-2016  . Hypertension   . 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 OA (osteoarthritis)    knees, hips  . Sleep apnea    use CPAP  . Spinal stenosis, lumbar   . Wears contact lenses     Patient Active Problem List   Diagnosis Date Noted  . History of revision of total replacement of right hip joint 08/15/2018  . Pain in joint of right hip 05/03/2018  . Osteoarthritis of knee 11/18/2017  . History of revision of total replacement of left hip joint 08/17/2017  . OA (osteoarthritis) of hip 08/02/2017  . Osteoarthritis of left knee 07/02/2017  . Arthritis of left hip 06/30/2017  . S/P lumbar spinal fusion 06/30/2017  . Spinal stenosis of lumbar region 03/03/2017  . Lumbar radiculopathy 02/24/2017  . S/P left knee arthroscopy 01/21/2017  . Acute lateral meniscus tear of left knee 12/31/2016  . Chronic pain of left knee 11/10/2016  . Cervical pain (neck) 06/01/2014  . Shoulder joint pain 06/01/2014    Past Surgical History:  Procedure Laterality Date  . CERVICAL SPINE SURGERY  1997   bone spur removal due to L UE radicular symptoms  . INCISION AND DRAINAGE COMPLEX POSTOPERATIVE WOUND INFECTION, BACK  05-13-2017  @ [facility]  . KNEE ARTHROSCOPY Left x6 last one 12/ 2018  . LUMBAR FUSION  2008   L3-4  . POSTERIOR  LUMBAR FUSION  04-12-2017   @ [facility]   L2-3, L5-S1 OLIF and posterior decompression; revision L2-S1 PSIF  . SHOULDER ARTHROSCOPY W/ SUBACROMIAL DECOMPRESSION AND DISTAL CLAVICLE EXCISION Left 08/02/14  . TOTAL HIP ARTHROPLASTY Left 08/02/2017   Procedure: LEFT TOTAL HIP ARTHROPLASTY ANTERIOR APPROACH;  Surgeon: Ollen Gross, MD;  Location: [facility];  Service: Orthopedics;  Laterality: Left;  . TOTAL HIP ARTHROPLASTY Right 07/13/2018   Procedure: TOTAL HIP ARTHROPLASTY ANTERIOR APPROACH;  Surgeon: Ollen Gross, MD;  Location: [facility];  Service: Orthopedics;  Laterality: Right;   . VITRECTOMY Right 03/18/2016       Home Medications    Prior to Admission medications   Medication Sig Start Date End Date Taking? Authorizing Provider  amitriptyline (ELAVIL) 100 MG tablet Take 200 mg by mouth at bedtime.   Yes [provider]  Diclofenac Sodium CR 100 MG 24 hr tablet TK 1 T PO QD 09/28/18  Yes [provider]  losartan-hydrochlorothiazide (HYZAAR) 100-25 MG tablet Take 1 tablet by mouth daily.   Yes [provider]  venlafaxine XR (EFFEXOR-XR) 75 MG 24 hr capsule Take 75 mg by mouth daily with breakfast.   Yes [provider]  ciprofloxacin (CIPRO) 250 MG tablet Take 1 tablet (  250 mg total) by mouth 2 (two) times daily. 10/20/18   Park Liter, DPM  clindamycin (CLEOCIN) 300 MG capsule Take 1 capsule (300 mg total) by mouth 2 (two) times daily. 10/20/18   Park Liter, DPM  fluticasone (FLONASE) 50 MCG/ACT nasal spray Place 1 spray into both nostrils daily as needed for allergies or rhinitis.    [provider]  levofloxacin (LEVAQUIN) 500 MG tablet Take 1 tablet (500 mg total) by mouth daily. 10/28/18   Park Liter, DPM  LORazepam (ATIVAN) 1 MG tablet TK 1 T PO TID AS NEEDED. DO NOT MX WITH ALCOHOL OR DRIVE AFTER U 9/48/54   [provider]  LORazepam (ATIVAN) 2 MG tablet Take 2 mg by mouth at bedtime.    [provider]   methocarbamol (ROBAXIN) 500 MG tablet Take 1 tablet (500 mg total) by mouth every 6 (six) hours as needed for muscle spasms. 07/14/18   Edmisten, Lyn Hollingshead, PA  methotrexate 50 MG/2ML injection ADM 1.5 ML [place] WEEKLY 09/06/18   [provider]  oxyCODONE-acetaminophen (PERCOCET) 5-325 MG tablet Take 1 tablet by mouth every 4 (four) hours as needed for severe pain. 10/20/18   Park Liter, DPM    Family History History reviewed. No pertinent family history.  Social History Social History   Tobacco Use  . Smoking status: Never Smoker  . Smokeless tobacco: Never Used  Vaping Use  . Vaping Use: Never used  Substance Use Topics  . Alcohol use: Yes    Comment: rare  . Drug use: Never     Allergies   Penicillins and Neurontin [gabapentin]   Review of Systems Review of Systems  Constitutional: Positive for activity change, appetite change, fatigue and fever. Negative for chills and diaphoresis.  HENT: Positive for postnasal drip. Negative for congestion, ear discharge, ear pain, rhinorrhea and trouble swallowing.   Eyes: Negative for discharge.  Respiratory: Positive for apnea, cough and shortness of breath.        Uses his C-pap qhs  Cardiovascular: Negative for chest pain.  Gastrointestinal: Negative for diarrhea and vomiting.  Musculoskeletal: Negative for gait problem and myalgias.  Skin: Negative for rash.  Neurological: Negative for headaches.  Hematological: Negative for adenopathy.     Physical Exam Triage Vital Signs ED Triage Vitals  Enc Vitals Group     BP 02/27/20 1026 (!) 149/80     Pulse Rate 02/27/20 1026 (!) 110     Resp 02/27/20 1026 18     Temp 02/27/20 1026 99 F (37.2 C)     Temp Source 02/27/20 1026 Oral     SpO2 02/27/20 1026 94 %     Weight 02/27/20 1049 290 lb (131.5 kg)     Height 02/27/20 1049 6\' 3"  (1.905 m)     Head Circumference --      Peak Flow --      Pain Score 02/27/20 1048 3     Pain Loc --      Pain Edu? --      Excl.  in GC? --    No data found.  Updated Vital Signs BP (!) 149/80 (BP Location: Left Arm)   Pulse (!) 110   Temp 99 F (37.2 C) (Oral)   Resp 18   Ht 6\' 3"  (1.905 m)   Wt 290 lb (131.5 kg)   SpO2 94%   BMI 36.25 kg/m   Visual Acuity Right Eye Distance:   Left Eye Distance:  Bilateral Distance:    Right Eye Near:   Left Eye Near:    Bilateral Near:     Physical Exam Vitals and nursing note reviewed.  Constitutional:      General: He is not in acute distress.    Appearance: He is obese. He is not toxic-appearing.  HENT:     Head: Normocephalic.     Mouth/Throat:     Mouth: Mucous membranes are moist.  Eyes:     Extraocular Movements: Extraocular movements intact.  Cardiovascular:     Rate and Rhythm: Normal rate and regular rhythm.     Heart sounds: No murmur heard.   Pulmonary:     Effort: Pulmonary effort is normal. Tachypnea present. No accessory muscle usage or respiratory distress.     Breath sounds: Examination of the right-lower field reveals wheezing. Wheezing present.     Comments: Seemed more SOB while wearing a mask than when he had it off and was able to talk full sentenses Musculoskeletal:        General: Normal range of motion.     Cervical back: Neck supple.  Lymphadenopathy:     Cervical: No cervical adenopathy.  Skin:    General: Skin is warm and dry.     Findings: No erythema or rash.  Neurological:     Mental Status: He is alert and oriented to person, place, and time.  Psychiatric:        Mood and Affect: Mood normal.        Behavior: Behavior normal.     UC Treatments / Results  Labs (all labs ordered are listed, but only abnormal results are displayed) Labs Reviewed  NOVEL CORONAVIRUS, NAA    EKG   Radiology No results found.  Procedures Procedures (including critical care time)  Medications Ordered in UC Medications - No data to display  Initial Impression / Assessment and Plan / UC Course  I have reviewed the triage  vital signs and the nursing notes. Pertinent  imaging results that were available during my care of the patient were reviewed by me and considered in my medical decision making (see chart for details). Covid test is pending. Has R middle lobe pneumonia which I believe is a secondary infection from Covid I placed him on Albuterol inhaler, Tessalon peerless, and Levaquin as noted. See instructions.   Final Clinical Impressions(s) / UC Diagnoses   Final diagnoses:  Encounter for laboratory testing for COVID-19 virus   Discharge Instructions   None    ED Prescriptions    None     PDMP not reviewed this encounter.   Garey Ham, [place] 02/27/20 1132

## 2020-02-27 NOTE — ED Triage Notes (Signed)
Pt c/o productive cough, congestion, runny nose, sore throat for approx 2 weeks. SOB for past three days. Fever onset last night of 102.9.   Last dose tylenol at 0800 this morning. Taking mucinex at night.   Pt with mild tachypnea at rest. Reports positive covid test on 01/30/20; negative repeat rapid test on 02/02/20.

## 2020-02-27 NOTE — Discharge Instructions (Signed)
You have right middle lung pneumonia and to make sure it has resolved completely, you need to have a repeated chest xray in 6-8 weeks with your family Dr Get a pulse ox machine to monitor your oxygen, if it drops to 92% or less, you need to go to the hospital to get oxygen.

## 2020-02-28 LAB — SARS-COV-2, NAA 2 DAY TAT

## 2020-02-28 LAB — NOVEL CORONAVIRUS, NAA: SARS-CoV-2, NAA: NOT DETECTED

## 2020-10-14 IMAGING — RF OPERATIVE RIGHT HIP WITH PELVIS
1 series · 3 of 3 positions shown · non-contrast
Comparison: None.

CLINICAL DATA: Right hip replacement.

EXAM:
OPERATIVE RIGHT HIP (WITH PELVIS IF PERFORMED) 3 VIEWS
TECHNIQUE: Fluoroscopic spot image(s) were submitted for interpretation
post-operatively.

[Series 1: run · 3 of 3 slices shown]
[im 1/3]
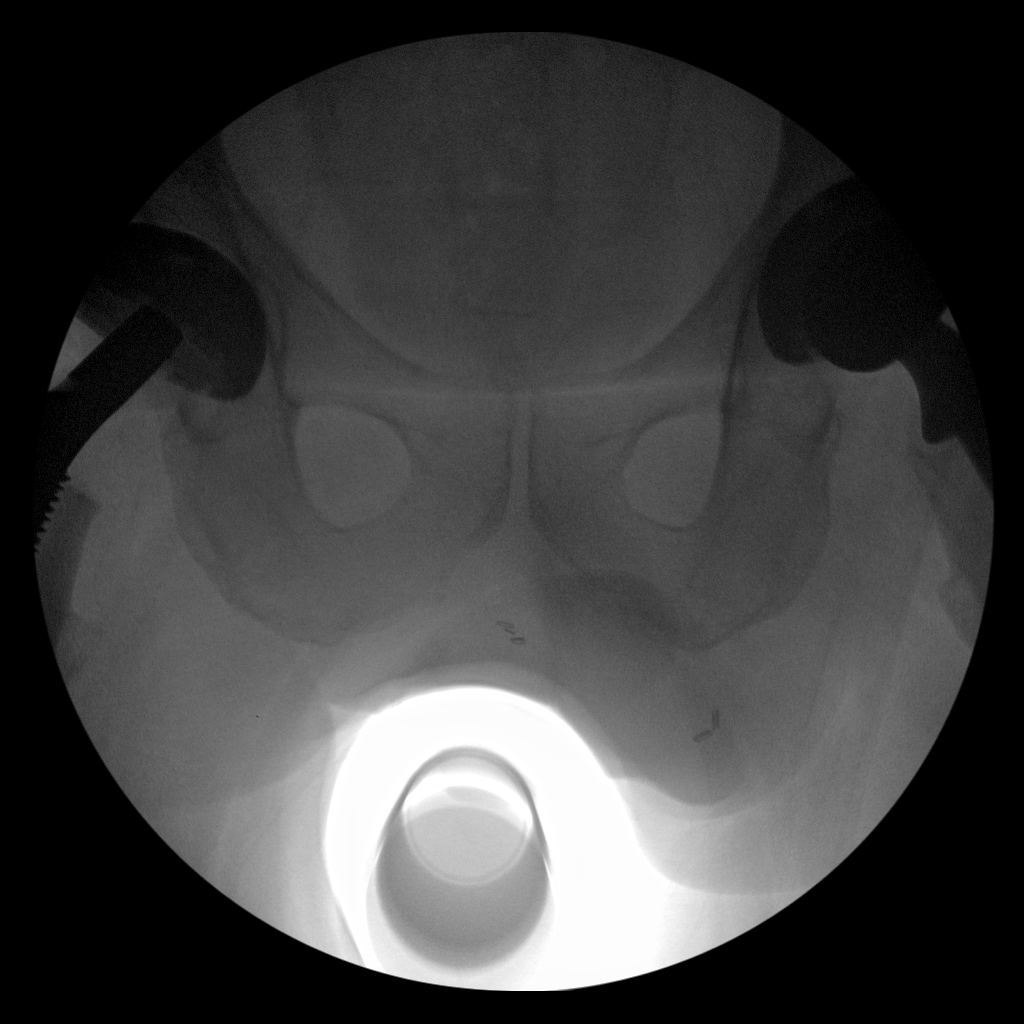
[im 2/3]
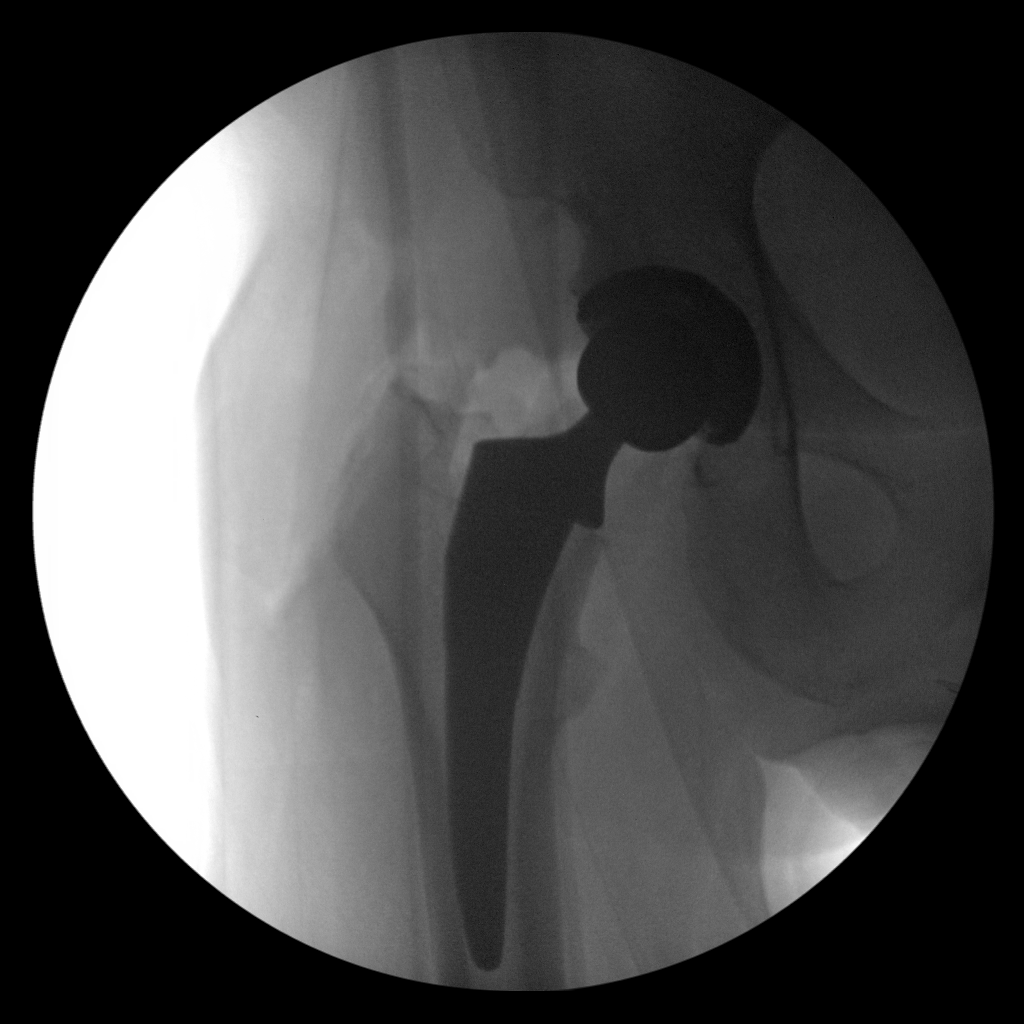
[im 3/3]
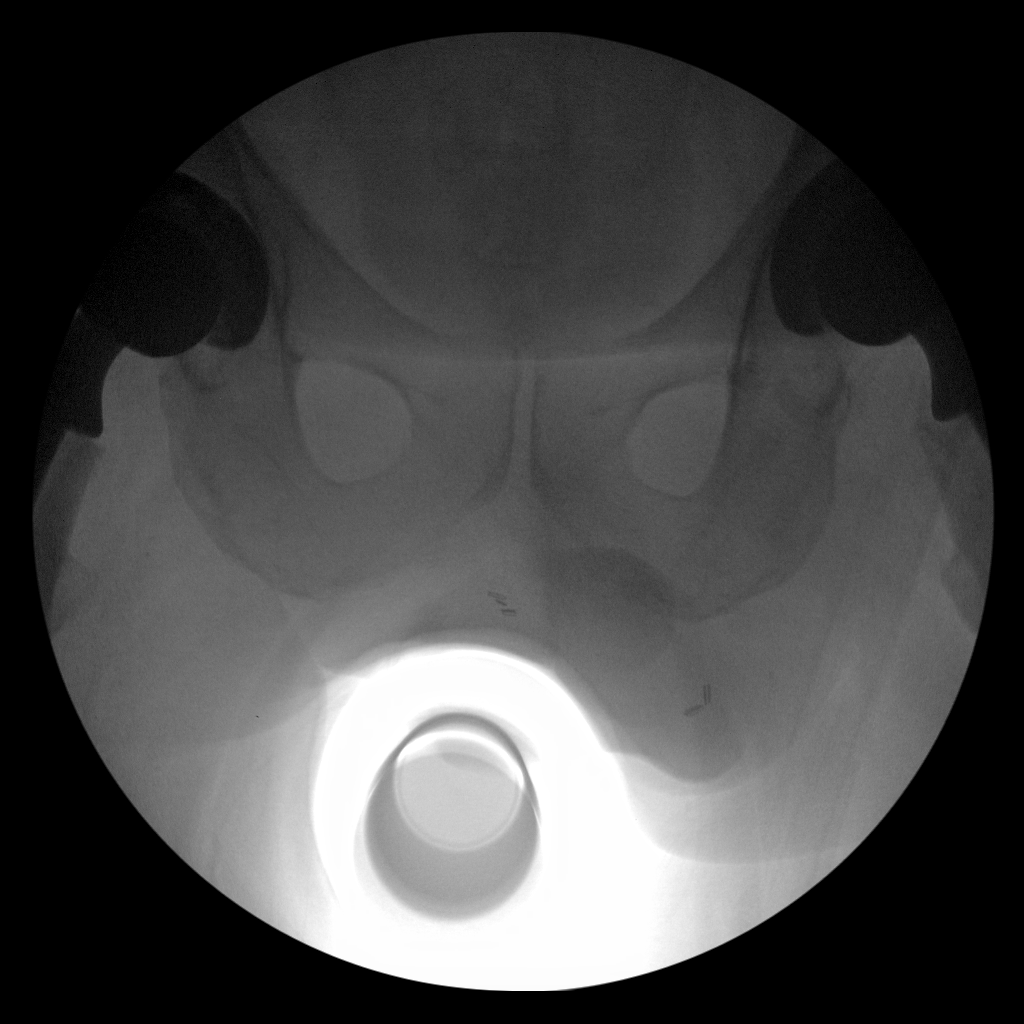

[3 of 3 positions shown; findings below may reference images not displayed]

FINDINGS: The patient is status post right hip replacement. Hardware is in
good position.
IMPRESSION: Right hip replacement as above.

## 2020-12-02 ENCOUNTER — Ambulatory Visit: Payer: Self-pay | Admitting: Orthopedic Surgery

## 2020-12-02 DIAGNOSIS — Z01818 Encounter for other preprocedural examination: Secondary | ICD-10-CM

## 2020-12-02 NOTE — H&P (Signed)
Subjective:   Previous posterior cervical decompression  PMH: MRSA infection s/p lumbar surgery CC: Neck pain with left arm pain and left arm weakness No improvement with recent cervical ESI Scheduled for ACDF C5-7 at [facility] on 12/11/20  Patient Active Problem List   Diagnosis Date Noted   History of revision of total replacement of right hip joint 08/15/2018   Pain in joint of right hip 05/03/2018   Osteoarthritis of knee 11/18/2017   History of revision of total replacement of left hip joint 08/17/2017   OA (osteoarthritis) of hip 08/02/2017   Osteoarthritis of left knee 07/02/2017   Arthritis of left hip 06/30/2017   S/P lumbar spinal fusion 06/30/2017   Spinal stenosis of lumbar region 03/03/2017   Lumbar radiculopathy 02/24/2017   S/P left knee arthroscopy 01/21/2017   Acute lateral meniscus tear of left knee 12/31/2016   Chronic pain of left knee 11/10/2016   Cervical pain (neck) 06/01/2014   Shoulder joint pain 06/01/2014   Past Medical History:  Diagnosis Date   Anxiety    Baker's cyst of knee, left    Small   Chronic pain of left knee    History of retinal detachment    right eye s/p repair 03-18-2016   Hypertension    Left leg weakness    secondary to left hip   OA (osteoarthritis)    knees, hips   Sleep apnea    use CPAP   Spinal stenosis, lumbar    Wears contact lenses     Past Surgical History:  Procedure Laterality Date   CERVICAL SPINE SURGERY  1997   bone spur removal due to L UE radicular symptoms   INCISION AND DRAINAGE COMPLEX POSTOPERATIVE WOUND INFECTION, BACK  05-13-2017  @ [facility]   KNEE ARTHROSCOPY Left x6 last one 12/ 2018   LUMBAR FUSION  2008   L3-4   POSTERIOR LUMBAR FUSION  04-12-2017   @ [facility]   L2-3, L5-S1 OLIF and posterior decompression; revision L2-S1 PSIF   SHOULDER ARTHROSCOPY W/ SUBACROMIAL DECOMPRESSION AND DISTAL CLAVICLE EXCISION Left 08/02/14   TOTAL HIP ARTHROPLASTY Left 08/02/2017   Procedure: LEFT TOTAL HIP ARTHROPLASTY  ANTERIOR APPROACH;  Surgeon: Ollen Gross, MD;  Location: [facility];  Service: Orthopedics;  Laterality: Left;   TOTAL HIP ARTHROPLASTY Right 07/13/2018   Procedure: TOTAL HIP ARTHROPLASTY ANTERIOR APPROACH;  Surgeon: Ollen Gross, MD;  Location: [facility];  Service: Orthopedics;  Laterality: Right;    VITRECTOMY Right 03/18/2016    Current Outpatient Medications  Medication Sig Dispense Refill Last Dose   albuterol (VENTOLIN HFA) 108 (90 Base) MCG/ACT inhaler Inhale 2 puffs into the lungs every 4 (four) hours as needed for wheezing or shortness of breath. 18 g 0    amitriptyline (ELAVIL) 100 MG tablet Take 200 mg by mouth at bedtime.      benzonatate (TESSALON) 200 MG capsule Take 1 capsule (200 mg total) by mouth 3 (three) times daily as needed for cough. 30 capsule 0    Diclofenac Sodium CR 100 MG 24 hr tablet TK 1 T PO QD      fluticasone (FLONASE) 50 MCG/ACT nasal spray Place 1 spray into both nostrils daily as needed for allergies or rhinitis.      levofloxacin (LEVAQUIN) 500 MG tablet Take 1 tablet (500 mg total) by mouth daily. 10 tablet 0    LORazepam (ATIVAN) 1 MG tablet TK 1 T PO TID AS NEEDED. DO NOT MX WITH ALCOHOL OR DRIVE AFTER U  LORazepam (ATIVAN) 2 MG tablet Take 2 mg by mouth at bedtime.      losartan-hydrochlorothiazide (HYZAAR) 100-25 MG tablet Take 1 tablet by mouth daily.      methocarbamol (ROBAXIN) 500 MG tablet Take 1 tablet (500 mg total) by mouth every 6 (six) hours as needed for muscle spasms. 40 tablet 0    venlafaxine XR (EFFEXOR-XR) 75 MG 24 hr capsule Take 75 mg by mouth daily with breakfast.      No current facility-administered medications for this visit.   Allergies  Allergen Reactions   Penicillins Rash and Other (See Comments)    Has patient had a PCN reaction causing immediate rash, facial/tongue/throat swelling, SOB or lightheadedness with hypotension: Unknown Has patient had a PCN reaction causing severe rash involving mucus membranes or skin  necrosis: Unknown Has patient had a PCN reaction that required hospitalization: Unknown Has patient had a PCN reaction occurring within the last 10 years: No If all of the above answers are "NO", then may proceed with Cephalosporin use.  Other reaction(s): Other (See Comments) Has patient had a PCN reaction causing immediate rash, facial/tongue/throat swelling, SOB or lightheadedness with hypotension: Unknown Has patient had a PCN reaction causing severe rash involving mucus membranes or skin necrosis: Unknown Has patient had a PCN reaction that required hospitalization: Unknown Has patient had a PCN reaction occurring within the last 10 years: No If all of the above answers are "NO", then may proceed with Cephalosporin use.   Neurontin [Gabapentin] Rash    Social History   Tobacco Use   Smoking status: Never   Smokeless tobacco: Never  Substance Use Topics   Alcohol use: Yes    Comment: rare    No family history on file.  Review of Systems Pertinent items are noted in HPI.  Objective:  Vitals: Ht: 6 ft 3 in 12/02/2020 11:06 am BP: 140/80 12/02/2020 11:06 am Pulse: 88 bpm 12/02/2020 11:07 am  Clinical exam: Hunter Swanson is a pleasant individual, who appears younger than their stated age. He is alert and orientated 3. No shortness of breath, chest pain.   Peripheral pulses: 2+ radial artery pulses bilaterally. Compartment soft and nontender.  Gait pattern: Normal  Assistive devices: None  Heart: Regular rate and rhythm, no rubs, murmurs, or gallops  Lungs: Clear auscultation bilaterally  Abdomen: Bowel sounds 4, nontender, nondistended, no loss of control of bladder or bowel function  Neuro: 5/5 motor strength in the right upper extremity. 4/5 left bicep, tricep, wrist extensor strength. 5/5 grip strength, deltoid strength. Positive left Spurling sign with reproduction of radicular arm pain. Positive numbness and dysesthesias and decreased sensation light touch in the left C6  and C7 dermatome. 1+ symmetrical deep tendon reflexes bilaterally. Negative Hoffman test. Negative inverted brachioradialis reflex.  Musculoskeletal: Significant neck pain with palpation and range of motion. Occasional occipital headaches. Well-healed surgical scar from previous posterior foraminotomy at C5/6 on the left side. Well-healed surgical scars from multiple lumbar surgeries ultimately resulting in an L2-5 fusion. Patient states the surgical procedure was complicated by a staph infection requiring revision surgery. Cervical x-rays taken today in the office (AP/lateral) were reviewed: Demonstrate normal sagittal alignment with mild degenerative disc disease and moderate facet arthrosis. No fracture or spondylolisthesis is noted.  Cervical MRI: completed on 11/22/20 was reviewed with the patient. It was completed at [facility]; I have independently reviewed the images as well as the radiology report. No cord signal changes. Severe left uncovertebral joint osteophytosis is noted along with moderate to  severe right neuroforaminal narrowing affecting the exiting C6 nerve root. Severe left neuroforaminal narrowing causing compression of the exiting C7 nerve root. Moderate C3-4 central stenosis with significant foraminal stenosis bilaterally. Significant right C4-5 foraminal narrowing causing impingement primarily on the right C5 nerve root.  Patient reports no significant improvement with recent cervical epidural steroid injection.  Assessment:   Hunter Swanson is a very pleasant 60 year old gentleman with at least a 3 month history of progressive neck pain and a 6-8 week history of severe left radicular arm pain. Clinically he has weakness and pain in the C6 and C7 dermatome. His MRI shows multilevel disease from C3-7. I have gone over the MRI and his clinical findings we have discussed treatment options which would include a 4 level ACDF versus a 2 level ACDF. At this point I have recommended that we address  the levels that were causing his clinical symptoms. He has no radiculopathy or radicular pain or deficits in the right upper extremity. His pain is in the left C6 and C7 dermatome which correlates with the C5-6 and C6-7 level. At this point in the absence of myelopathy in the isolated left cervical radiculopathy which has failed to improve with injection therapy I think surgical intervention is warranted.   Plan:   Risks and benefits of surgery were discussed with the patient. These include: Infection, bleeding, death, stroke, paralysis, ongoing or worse pain, need for additional surgery, nonunion, leak of spinal fluid, adjacent segment degeneration requiring additional fusion surgery. Pseudoarthrosis (nonunion)requiring supplemental posterior fixation. Throat pain, swallowing difficulties, hoarseness or change in voice.  We have obtained preoperative medical clearance from the patient's primary care provider.  I have reviewed the patient's medication list. He is not on any blood thinners. Not using any aspirin. He is taking diclofenac which I have advised him to stop 7 days prior to surgery. I have also advised him to avoid any over-the-counter NSAIDs. He expressed understanding of this.  He has an appointment scheduled with PT for Aspen collar fitting  We have also discussed the post-operative recovery period to include: bathing/showering restrictions, wound healing, activity (and driving) restrictions, medications/pain mangement.  We have also discussed post-operative redflags to include: signs and symptoms of postoperative infection, DVT/PE.  Discharge instructions the patient and he is given a copy at today's visit.  All of his questions were invited and answered  Follow-up: 2 weeks postop

## 2020-12-02 NOTE — H&P (Deleted)
  The note originally documented on this encounter has been moved the the encounter in which it belongs.  

## 2020-12-06 NOTE — Progress Notes (Signed)
Surgical Instructions    Your procedure is scheduled on 12/11/20.  Report to [facility] Main Entrance "A" at 11:45 A.M., then check in with the Admitting office.  Call this number if you have problems the morning of surgery:  [phone]   If you have any questions prior to your surgery date call [phone]: Open Monday-Friday 8am-4pm    Remember:  Do not eat after midnight the night before your surgery  You may drink clear liquids until 10:45am the morning of your surgery.   Clear liquids allowed are: Water, Non-Citrus Juices (without pulp), Carbonated Beverages, Clear Tea, Black Coffee ONLY (NO MILK, CREAM OR POWDERED CREAMER of any kind), and Gatorade    Take these medicines the morning of surgery with A SIP OF WATER  venlafaxine XR (EFFEXOR-XR)    As of today, STOP taking any Aspirin (unless otherwise instructed by your surgeon) Aleve, Naproxen, Ibuprofen, Motrin, Advil, Goody's, BC's, all herbal medications, fish oil, Diclofenac Sodium and all vitamins.     After your COVID test   You are not required to quarantine however you are required to wear a well-fitting mask when you are out and around people not in your household.  If your mask becomes wet or soiled, replace with a new one.  Wash your hands often with soap and water for 20 seconds or clean your hands with an alcohol-based hand sanitizer that contains at least 60% alcohol.  Do not share personal items.  Notify your provider: if you are in close contact with someone who has COVID  or if you develop a fever of 100.4 or greater, sneezing, cough, sore throat, shortness of breath or body aches.             Do not wear jewelry or makeup Do not wear lotions, powders, perfumes/colognes, or deodorant. Men may shave face and neck. Do not bring valuables to the hospital.  DO Not wear nail polish, gel polish, artificial nails, or any other type of covering on natural nails including finger and toenails. If patients  have artificial nails, gel coating, etc. that need to be removed by a nail salon, please have this removed prior to surgery or surgery may need to be canceled/delayed if the surgeon/ anesthesia feels like the patient is unable to be adequately monitored.              is not responsible for any belongings or valuables.  Do NOT Smoke (Tobacco/Vaping)  24 hours prior to your procedure  If you use a CPAP at night, you may bring your mask for your overnight stay.   Contacts, glasses, hearing aids, dentures or partials may not be worn into surgery, please bring cases for these belongings   For patients admitted to the hospital, discharge time will be determined by your treatment team.   Patients discharged the day of surgery will not be allowed to drive home, and someone needs to stay with them for 24 hours.  NO VISITORS WILL BE ALLOWED IN PRE-OP WHERE PATIENTS ARE PREPPED FOR SURGERY.  ONLY 1 SUPPORT PERSON MAY BE PRESENT IN THE WAITING ROOM WHILE YOU ARE IN SURGERY.  IF YOU ARE TO BE ADMITTED, ONCE YOU ARE IN YOUR ROOM YOU WILL BE ALLOWED TWO (2) VISITORS. 1 (ONE) VISITOR MAY STAY OVERNIGHT BUT MUST ARRIVE TO THE ROOM BY 8pm.  Minor children may have two parents present. Special consideration for safety and communication needs will be reviewed on a case by case basis.  Special  instructions:    Oral Hygiene is also important to reduce your risk of infection.  Remember - BRUSH YOUR TEETH THE MORNING OF SURGERY WITH YOUR REGULAR TOOTHPASTE   [facility]- Preparing For Surgery  Before surgery, you can play an important role. Because skin is not sterile, your skin needs to be as free of germs as possible. You can reduce the number of germs on your skin by washing with CHG (chlorahexidine gluconate) Soap before surgery.  CHG is an antiseptic cleaner which kills germs and bonds with the skin to continue killing germs even after washing.     Please do not use if you have an allergy to CHG or  antibacterial soaps. If your skin becomes reddened/irritated stop using the CHG.  Do not shave (including legs and underarms) for at least 48 hours prior to first CHG shower. It is OK to shave your face.  Please follow these instructions carefully.     Shower the NIGHT BEFORE SURGERY and the MORNING OF SURGERY with CHG Soap.   If you chose to wash your hair, wash your hair first as usual with your normal shampoo. After you shampoo, rinse your hair and body thoroughly to remove the shampoo.  Then [employer] and genitals (private parts) with your normal soap and rinse thoroughly to remove soap.  After that Use CHG Soap as you would any other liquid soap. You can apply CHG directly to the skin and wash gently with a scrungie or a clean washcloth.   Apply the CHG Soap to your body ONLY FROM THE NECK DOWN.  Do not use on open wounds or open sores. Avoid contact with your eyes, ears, mouth and genitals (private parts). Wash Face and genitals (private parts)  with your normal soap.   Wash thoroughly, paying special attention to the area where your surgery will be performed.  Thoroughly rinse your body with warm water from the neck down.  DO NOT shower/wash with your normal soap after using and rinsing off the CHG Soap.  Pat yourself dry with a CLEAN TOWEL.  Wear CLEAN PAJAMAS to bed the night before surgery  Place CLEAN SHEETS on your bed the night before your surgery  DO NOT SLEEP WITH PETS.   Day of Surgery: Take a shower with CHG soap. Wear Clean/Comfortable clothing the morning of surgery Do not apply any deodorants/lotions.   Remember to brush your teeth WITH YOUR REGULAR TOOTHPASTE.   Please read over the following fact sheets that you were given.

## 2020-12-09 ENCOUNTER — Encounter (HOSPITAL_COMMUNITY)
Admission: RE | Admit: 2020-12-09 | Discharge: 2020-12-09 | Disposition: A | Discharge status: Home / Self Care | Payer: Managed Care, Other (non HMO) | Source: Ambulatory Visit | Attending: Orthopedic Surgery | Admitting: Orthopedic Surgery

## 2020-12-09 ENCOUNTER — Other Ambulatory Visit: Payer: Self-pay

## 2020-12-09 ENCOUNTER — Encounter (HOSPITAL_COMMUNITY): Payer: Self-pay

## 2020-12-09 VITALS — BP 134/56 | HR 89 | Temp 98.3°F | Resp 19 | Ht 75.0 in | Wt 300.0 lb

## 2020-12-09 DIAGNOSIS — Z01818 Encounter for other preprocedural examination: Secondary | ICD-10-CM | POA: Diagnosis not present

## 2020-12-09 DIAGNOSIS — Z20822 Contact with and (suspected) exposure to covid-19: Secondary | ICD-10-CM | POA: Diagnosis not present

## 2020-12-09 HISTORY — DX: Myoneural disorder, unspecified: G70.9

## 2020-12-09 HISTORY — DX: Pneumonia, unspecified organism: J18.9

## 2020-12-09 LAB — CBC
HCT: 50.5 % (ref 39.0–52.0)
Hemoglobin: 17.3 g/dL — ABNORMAL HIGH (ref 13.0–17.0)
MCH: 32.4 pg (ref 26.0–34.0)
MCHC: 34.3 g/dL (ref 30.0–36.0)
MCV: 94.6 fL (ref 80.0–100.0)
Platelets: 259 10*3/uL (ref 150–400)
RBC: 5.34 MIL/uL (ref 4.22–5.81)
RDW: 12.7 % (ref 11.5–15.5)
WBC: 8.4 10*3/uL (ref 4.0–10.5)
nRBC: 0 % (ref 0.0–0.2)

## 2020-12-09 LAB — BASIC METABOLIC PANEL
Anion gap: 9 (ref 5–15)
BUN: 24 mg/dL — ABNORMAL HIGH (ref 8–23)
CO2: 29 mmol/L (ref 22–32)
Calcium: 9.4 mg/dL (ref 8.9–10.3)
Chloride: 100 mmol/L (ref 98–111)
Creatinine, Ser: 1.13 mg/dL (ref 0.61–1.24)
GFR, Estimated: >60 mL/min (ref >60)
Glucose, Bld: 91 mg/dL (ref 70–99)
Potassium: 4.2 mmol/L (ref 3.5–5.1)
Sodium: 138 mmol/L (ref 135–145)

## 2020-12-09 LAB — SARS CORONAVIRUS 2 (TAT 6-24 HRS): SARS Coronavirus 2: NEGATIVE

## 2020-12-09 LAB — URINALYSIS, ROUTINE W REFLEX MICROSCOPIC
Bilirubin Urine: NEGATIVE
Glucose, UA: NEGATIVE mg/dL
Hgb urine dipstick: NEGATIVE
Ketones, ur: NEGATIVE mg/dL
Leukocytes,Ua: NEGATIVE
Nitrite: NEGATIVE
Protein, ur: NEGATIVE mg/dL
Specific Gravity, Urine: 1.025 (ref 1.005–1.030)
pH: 5.5 (ref 5.0–8.0)

## 2020-12-09 LAB — SURGICAL PCR SCREEN
MRSA, PCR: NEGATIVE
Staphylococcus aureus: POSITIVE — AB

## 2020-12-09 LAB — APTT: aPTT: 30 seconds (ref 24–36)

## 2020-12-09 LAB — PROTIME-INR
INR: 1 (ref 0.8–1.2)
Prothrombin Time: 12.8 seconds (ref 11.4–15.2)

## 2020-12-09 NOTE — Progress Notes (Signed)
PCP - cara davis Cardiologist - denies  PPM/ICD - denies   Chest x-ray - denies EKG - 12/09/20 Stress Test - denies ECHO - denies Cardiac Cath - denies  Sleep Study - +OSA CPAP - wear nightly, settings at 10  No diabetes  As of today, STOP taking any Aspirin (unless otherwise instructed by your surgeon) Aleve, Naproxen, Ibuprofen, Motrin, Advil, Goody's, BC's, all herbal medications, fish oil, Diclofenac Sodium and all vitamins.  ERAS Protcol -yes PRE-SURGERY Ensure or G2- not ordered  COVID TEST- 12/09/20 in PAT   Anesthesia review: yes, per order  Patient denies shortness of breath, fever, cough and chest pain at PAT appointment   All instructions explained to the patient, with a verbal understanding of the material. Patient agrees to go over the instructions while at home for a better understanding. Patient also instructed to self quarantine after being tested for COVID-19. The opportunity to ask questions was provided.

## 2020-12-11 ENCOUNTER — Observation Stay (HOSPITAL_COMMUNITY)
Admission: RE | Admit: 2020-12-11 | Discharge: 2020-12-12 | Disposition: A | Discharge status: Home / Self Care | Payer: Managed Care, Other (non HMO) | Attending: Orthopedic Surgery | Admitting: Orthopedic Surgery

## 2020-12-11 ENCOUNTER — Encounter (HOSPITAL_COMMUNITY)
Admission: RE | Disposition: A | Discharge status: Home / Self Care | Payer: Self-pay | Source: Home / Self Care | Attending: Orthopedic Surgery

## 2020-12-11 ENCOUNTER — Encounter (HOSPITAL_COMMUNITY): Payer: Self-pay | Admitting: Orthopedic Surgery

## 2020-12-11 ENCOUNTER — Ambulatory Visit (HOSPITAL_COMMUNITY): Payer: Managed Care, Other (non HMO) | Admitting: Anesthesiology

## 2020-12-11 ENCOUNTER — Ambulatory Visit (HOSPITAL_COMMUNITY): Payer: Managed Care, Other (non HMO)

## 2020-12-11 ENCOUNTER — Other Ambulatory Visit: Payer: Self-pay

## 2020-12-11 DIAGNOSIS — I10 Essential (primary) hypertension: Secondary | ICD-10-CM | POA: Diagnosis not present

## 2020-12-11 DIAGNOSIS — Z96643 Presence of artificial hip joint, bilateral: Secondary | ICD-10-CM | POA: Insufficient documentation

## 2020-12-11 DIAGNOSIS — Z79899 Other long term (current) drug therapy: Secondary | ICD-10-CM | POA: Diagnosis not present

## 2020-12-11 DIAGNOSIS — M5412 Radiculopathy, cervical region: Principal | ICD-10-CM | POA: Diagnosis present

## 2020-12-11 DIAGNOSIS — Z419 Encounter for procedure for purposes other than remedying health state, unspecified: Secondary | ICD-10-CM

## 2020-12-11 HISTORY — PX: ANTERIOR CERVICAL DECOMP/DISCECTOMY FUSION: SHX1161

## 2020-12-11 SURGERY — ANTERIOR CERVICAL DECOMPRESSION/DISCECTOMY FUSION 2 LEVELS
Anesthesia: General

## 2020-12-11 MED ORDER — LIDOCAINE 2% (20 MG/ML) 5 ML SYRINGE
INTRAMUSCULAR | Status: AC
  Filled 2020-12-11: qty 5

## 2020-12-11 MED ORDER — CHLORHEXIDINE GLUCONATE 0.12 % MT SOLN
OROMUCOSAL | Status: AC
  Administered 2020-12-11: 15 mL via OROMUCOSAL
  Filled 2020-12-11: qty 15

## 2020-12-11 MED ORDER — CHLORHEXIDINE GLUCONATE 0.12 % MT SOLN: 15.0000 mL | Freq: Once | OROMUCOSAL | Status: AC

## 2020-12-11 MED ORDER — ACETAMINOPHEN 650 MG RE SUPP: 650.0000 mg | RECTAL | Status: DC | PRN

## 2020-12-11 MED ORDER — PHENYLEPHRINE HCL (PRESSORS) 10 MG/ML IV SOLN
INTRAVENOUS | Status: AC
  Filled 2020-12-11: qty 1

## 2020-12-11 MED ORDER — DEXAMETHASONE SODIUM PHOSPHATE 4 MG/ML IJ SOLN
4.0000 mg | Freq: Four times a day (QID) | INTRAMUSCULAR | Status: DC
  Administered 2020-12-11 – 2020-12-12 (×2): 4 mg via INTRAVENOUS
  Filled 2020-12-11 (×2): qty 1

## 2020-12-11 MED ORDER — OXYCODONE HCL 5 MG/5ML PO SOLN: 5.0000 mg | Freq: Once | ORAL | Status: DC | PRN

## 2020-12-11 MED ORDER — SUCCINYLCHOLINE CHLORIDE 200 MG/10ML IV SOSY
PREFILLED_SYRINGE | INTRAVENOUS | Status: DC | PRN
  Administered 2020-12-11: 160 mg via INTRAVENOUS

## 2020-12-11 MED ORDER — FENTANYL CITRATE (PF) 250 MCG/5ML IJ SOLN
INTRAMUSCULAR | Status: DC | PRN
  Administered 2020-12-11: 50 ug via INTRAVENOUS
  Administered 2020-12-11: 100 ug via INTRAVENOUS

## 2020-12-11 MED ORDER — 0.9 % SODIUM CHLORIDE (POUR BTL) OPTIME
TOPICAL | Status: DC | PRN
  Administered 2020-12-11: 1000 mL

## 2020-12-11 MED ORDER — LACTATED RINGERS IV SOLN: INTRAVENOUS | Status: DC

## 2020-12-11 MED ORDER — METHOCARBAMOL 500 MG PO TABS: 500.0000 mg | ORAL_TABLET | Freq: Three times a day (TID) | ORAL | 0 refills | Status: AC | PRN

## 2020-12-11 MED ORDER — ACETAMINOPHEN 500 MG PO TABS
1000.0000 mg | ORAL_TABLET | Freq: Four times a day (QID) | ORAL | Status: DC
  Administered 2020-12-11 – 2020-12-12 (×3): 1000 mg via ORAL
  Filled 2020-12-11 (×3): qty 2

## 2020-12-11 MED ORDER — ACETAMINOPHEN 325 MG PO TABS: 650.0000 mg | ORAL_TABLET | ORAL | Status: DC | PRN

## 2020-12-11 MED ORDER — METHOCARBAMOL 1000 MG/10ML IJ SOLN
500.0000 mg | Freq: Four times a day (QID) | INTRAVENOUS | Status: DC | PRN
  Filled 2020-12-11: qty 5

## 2020-12-11 MED ORDER — PHENOL 1.4 % MT LIQD: 1.0000 | OROMUCOSAL | Status: DC | PRN

## 2020-12-11 MED ORDER — LIDOCAINE 2% (20 MG/ML) 5 ML SYRINGE
INTRAMUSCULAR | Status: DC | PRN
  Administered 2020-12-11: 100 mg via INTRAVENOUS

## 2020-12-11 MED ORDER — HYDROMORPHONE HCL 1 MG/ML IJ SOLN: 1.0000 mg | INTRAMUSCULAR | Status: AC | PRN

## 2020-12-11 MED ORDER — OXYCODONE HCL 5 MG PO TABS: 5.0000 mg | ORAL_TABLET | ORAL | Status: DC | PRN

## 2020-12-11 MED ORDER — TRANEXAMIC ACID-NACL 1000-0.7 MG/100ML-% IV SOLN
INTRAVENOUS | Status: AC
  Filled 2020-12-11: qty 100

## 2020-12-11 MED ORDER — SODIUM CHLORIDE 0.9% FLUSH: 3.0000 mL | INTRAVENOUS | Status: DC | PRN

## 2020-12-11 MED ORDER — BUPIVACAINE-EPINEPHRINE 0.25% -1:200000 IJ SOLN
INTRAMUSCULAR | Status: DC | PRN
  Administered 2020-12-11: 6 mL

## 2020-12-11 MED ORDER — PROPOFOL 10 MG/ML IV BOLUS
INTRAVENOUS | Status: AC
  Filled 2020-12-11: qty 20

## 2020-12-11 MED ORDER — ONDANSETRON HCL 4 MG PO TABS: 4.0000 mg | ORAL_TABLET | Freq: Three times a day (TID) | ORAL | 0 refills | Status: DC | PRN

## 2020-12-11 MED ORDER — THROMBIN 20000 UNITS EX SOLR
CUTANEOUS | Status: DC | PRN
  Administered 2020-12-11: 20 mL via TOPICAL

## 2020-12-11 MED ORDER — FENTANYL CITRATE (PF) 250 MCG/5ML IJ SOLN
INTRAMUSCULAR | Status: AC
  Filled 2020-12-11: qty 5

## 2020-12-11 MED ORDER — ROCURONIUM BROMIDE 10 MG/ML (PF) SYRINGE
PREFILLED_SYRINGE | INTRAVENOUS | Status: DC | PRN
  Administered 2020-12-11: 70 mg via INTRAVENOUS

## 2020-12-11 MED ORDER — DROPERIDOL 2.5 MG/ML IJ SOLN: 0.6250 mg | Freq: Once | INTRAMUSCULAR | Status: DC | PRN

## 2020-12-11 MED ORDER — ONDANSETRON HCL 4 MG/2ML IJ SOLN
INTRAMUSCULAR | Status: AC
  Filled 2020-12-11: qty 2

## 2020-12-11 MED ORDER — LOSARTAN POTASSIUM-HCTZ 100-25 MG PO TABS: 1.0000 | ORAL_TABLET | Freq: Every day | ORAL | Status: DC

## 2020-12-11 MED ORDER — OXYCODONE-ACETAMINOPHEN 10-325 MG PO TABS: 1.0000 | ORAL_TABLET | Freq: Four times a day (QID) | ORAL | 0 refills | Status: AC | PRN

## 2020-12-11 MED ORDER — DEXAMETHASONE SODIUM PHOSPHATE 10 MG/ML IJ SOLN
INTRAMUSCULAR | Status: AC
  Filled 2020-12-11: qty 1

## 2020-12-11 MED ORDER — SODIUM CHLORIDE 0.9% FLUSH
3.0000 mL | Freq: Two times a day (BID) | INTRAVENOUS | Status: DC
  Administered 2020-12-11: 3 mL via INTRAVENOUS

## 2020-12-11 MED ORDER — VANCOMYCIN HCL 1500 MG/300ML IV SOLN
1500.0000 mg | INTRAVENOUS | Status: AC
  Administered 2020-12-11: 1500 mg via INTRAVENOUS
  Filled 2020-12-11: qty 300

## 2020-12-11 MED ORDER — EPHEDRINE 5 MG/ML INJ
INTRAVENOUS | Status: AC
  Filled 2020-12-11: qty 5

## 2020-12-11 MED ORDER — TRANEXAMIC ACID-NACL 1000-0.7 MG/100ML-% IV SOLN
INTRAVENOUS | Status: DC | PRN
  Administered 2020-12-11: 1000 mg via INTRAVENOUS

## 2020-12-11 MED ORDER — ONDANSETRON HCL 4 MG PO TABS: 4.0000 mg | ORAL_TABLET | Freq: Four times a day (QID) | ORAL | Status: DC | PRN

## 2020-12-11 MED ORDER — THROMBIN 20000 UNITS EX SOLR
CUTANEOUS | Status: AC
  Filled 2020-12-11: qty 20000

## 2020-12-11 MED ORDER — GLYCOPYRROLATE PF 0.2 MG/ML IJ SOSY
PREFILLED_SYRINGE | INTRAMUSCULAR | Status: DC | PRN
  Administered 2020-12-11: .2 mg via INTRAVENOUS

## 2020-12-11 MED ORDER — POLYETHYLENE GLYCOL 3350 17 G PO PACK: 17.0000 g | PACK | Freq: Every day | ORAL | Status: DC | PRN

## 2020-12-11 MED ORDER — MIDAZOLAM HCL 2 MG/2ML IJ SOLN
INTRAMUSCULAR | Status: DC | PRN
  Administered 2020-12-11: 2 mg via INTRAVENOUS

## 2020-12-11 MED ORDER — PROPOFOL 10 MG/ML IV BOLUS
INTRAVENOUS | Status: DC | PRN
  Administered 2020-12-11: 200 mg via INTRAVENOUS

## 2020-12-11 MED ORDER — MIDAZOLAM HCL 2 MG/2ML IJ SOLN
INTRAMUSCULAR | Status: AC
  Filled 2020-12-11: qty 2

## 2020-12-11 MED ORDER — EPHEDRINE SULFATE-NACL 50-0.9 MG/10ML-% IV SOSY
PREFILLED_SYRINGE | INTRAVENOUS | Status: DC | PRN
  Administered 2020-12-11 (×3): 10 mg via INTRAVENOUS
  Administered 2020-12-11: 15 mg via INTRAVENOUS
  Administered 2020-12-11: 5 mg via INTRAVENOUS

## 2020-12-11 MED ORDER — AMITRIPTYLINE HCL 50 MG PO TABS
200.0000 mg | ORAL_TABLET | Freq: Every day | ORAL | Status: DC
  Administered 2020-12-11: 200 mg via ORAL
  Filled 2020-12-11: qty 2
  Filled 2020-12-11 (×2): qty 4
  Filled 2020-12-11: qty 2

## 2020-12-11 MED ORDER — METHOCARBAMOL 500 MG PO TABS
500.0000 mg | ORAL_TABLET | Freq: Four times a day (QID) | ORAL | Status: DC | PRN
  Administered 2020-12-11 – 2020-12-12 (×2): 500 mg via ORAL
  Filled 2020-12-11 (×2): qty 1

## 2020-12-11 MED ORDER — HYDROCHLOROTHIAZIDE 25 MG PO TABS: 25.0000 mg | ORAL_TABLET | Freq: Every day | ORAL | Status: DC

## 2020-12-11 MED ORDER — ACETAMINOPHEN 500 MG PO TABS: 1000.0000 mg | ORAL_TABLET | Freq: Once | ORAL | Status: AC

## 2020-12-11 MED ORDER — DEXAMETHASONE 4 MG PO TABS
4.0000 mg | ORAL_TABLET | Freq: Four times a day (QID) | ORAL | Status: DC
  Administered 2020-12-11: 4 mg via ORAL
  Filled 2020-12-11 (×2): qty 1

## 2020-12-11 MED ORDER — ROCURONIUM BROMIDE 10 MG/ML (PF) SYRINGE
PREFILLED_SYRINGE | INTRAVENOUS | Status: AC
  Filled 2020-12-11: qty 10

## 2020-12-11 MED ORDER — PHENYLEPHRINE HCL-NACL 20-0.9 MG/250ML-% IV SOLN
INTRAVENOUS | Status: DC | PRN
  Administered 2020-12-11: 100 ug/min via INTRAVENOUS

## 2020-12-11 MED ORDER — GLYCOPYRROLATE PF 0.2 MG/ML IJ SOSY
PREFILLED_SYRINGE | INTRAMUSCULAR | Status: AC
  Filled 2020-12-11: qty 1

## 2020-12-11 MED ORDER — FENTANYL CITRATE (PF) 100 MCG/2ML IJ SOLN
INTRAMUSCULAR | Status: AC
  Filled 2020-12-11: qty 2

## 2020-12-11 MED ORDER — VENLAFAXINE HCL ER 75 MG PO CP24: 75.0000 mg | ORAL_CAPSULE | Freq: Every day | ORAL | Status: DC

## 2020-12-11 MED ORDER — BUPIVACAINE-EPINEPHRINE (PF) 0.25% -1:200000 IJ SOLN
INTRAMUSCULAR | Status: AC
  Filled 2020-12-11: qty 30

## 2020-12-11 MED ORDER — FENTANYL CITRATE (PF) 100 MCG/2ML IJ SOLN
25.0000 ug | INTRAMUSCULAR | Status: DC | PRN
  Administered 2020-12-11 (×2): 50 ug via INTRAVENOUS

## 2020-12-11 MED ORDER — MENTHOL 3 MG MT LOZG: 1.0000 | LOZENGE | OROMUCOSAL | Status: DC | PRN

## 2020-12-11 MED ORDER — PROMETHAZINE HCL 25 MG/ML IJ SOLN: 6.2500 mg | INTRAMUSCULAR | Status: DC | PRN

## 2020-12-11 MED ORDER — ONDANSETRON HCL 4 MG/2ML IJ SOLN: 4.0000 mg | Freq: Four times a day (QID) | INTRAMUSCULAR | Status: DC | PRN

## 2020-12-11 MED ORDER — OXYCODONE HCL 5 MG PO TABS: 5.0000 mg | ORAL_TABLET | Freq: Once | ORAL | Status: DC | PRN

## 2020-12-11 MED ORDER — OXYCODONE HCL 5 MG PO TABS
10.0000 mg | ORAL_TABLET | ORAL | Status: DC | PRN
  Administered 2020-12-11 – 2020-12-12 (×3): 10 mg via ORAL
  Filled 2020-12-11 (×3): qty 2

## 2020-12-11 MED ORDER — SODIUM CHLORIDE 0.9 % IV SOLN: 250.0000 mL | INTRAVENOUS | Status: DC

## 2020-12-11 MED ORDER — ACETAMINOPHEN 500 MG PO TABS
ORAL_TABLET | ORAL | Status: AC
  Administered 2020-12-11: 1000 mg via ORAL
  Filled 2020-12-11: qty 2

## 2020-12-11 MED ORDER — SUGAMMADEX SODIUM 200 MG/2ML IV SOLN
INTRAVENOUS | Status: DC | PRN
  Administered 2020-12-11: 200 mg via INTRAVENOUS

## 2020-12-11 MED ORDER — VANCOMYCIN HCL 1250 MG/250ML IV SOLN
1250.0000 mg | Freq: Two times a day (BID) | INTRAVENOUS | Status: DC
  Administered 2020-12-11: 1250 mg via INTRAVENOUS
  Filled 2020-12-11 (×2): qty 250

## 2020-12-11 MED ORDER — LOSARTAN POTASSIUM 50 MG PO TABS: 100.0000 mg | ORAL_TABLET | Freq: Every day | ORAL | Status: DC

## 2020-12-11 MED ORDER — SURGIFLO WITH THROMBIN (HEMOSTATIC MATRIX KIT) OPTIME
TOPICAL | Status: DC | PRN
  Administered 2020-12-11: 1 via TOPICAL

## 2020-12-11 MED ORDER — DEXAMETHASONE SODIUM PHOSPHATE 10 MG/ML IJ SOLN
INTRAMUSCULAR | Status: DC | PRN
  Administered 2020-12-11: 10 mg via INTRAVENOUS

## 2020-12-11 MED ORDER — ORAL CARE MOUTH RINSE: 15.0000 mL | Freq: Once | OROMUCOSAL | Status: AC

## 2020-12-11 MED ORDER — SUCCINYLCHOLINE CHLORIDE 200 MG/10ML IV SOSY
PREFILLED_SYRINGE | INTRAVENOUS | Status: AC
  Filled 2020-12-11: qty 10

## 2020-12-11 SURGICAL SUPPLY — 66 items
BAG COUNTER SPONGE SURGICOUNT (BAG) ×2 IMPLANT
BAG SURGICOUNT SPONGE COUNTING (BAG) ×1
BLADE CLIPPER SURG (BLADE) IMPLANT
BUR EGG ELITE 4.0 (BURR) IMPLANT
BUR EGG ELITE 4.0MM (BURR)
BUR MATCHSTICK NEURO 3.0 LAGG (BURR) IMPLANT
CABLE BIPOLOR RESECTION CORD (MISCELLANEOUS) ×3 IMPLANT
CANISTER SUCT 3000ML PPV (MISCELLANEOUS) ×3 IMPLANT
CLOSURE STERI-STRIP 1/2X4 (GAUZE/BANDAGES/DRESSINGS)
CLOSURE WOUND 1/2 X4 (GAUZE/BANDAGES/DRESSINGS) ×2
CLSR STERI-STRIP ANTIMIC 1/2X4 (GAUZE/BANDAGES/DRESSINGS) IMPLANT
COVER MAYO STAND STRL (DRAPES) ×9 IMPLANT
COVER SURGICAL LIGHT HANDLE (MISCELLANEOUS) ×6 IMPLANT
DEVICE EDNSKLTN TC NNLCK MED 8 (Cage) ×2 IMPLANT
DRAIN CHANNEL 15F RND FF W/TCR (WOUND CARE) IMPLANT
DRAPE C-ARM 42X72 X-RAY (DRAPES) ×3 IMPLANT
DRAPE POUCH INSTRU U-SHP 10X18 (DRAPES) ×3 IMPLANT
DRAPE SURG 17X23 STRL (DRAPES) ×3 IMPLANT
DRAPE U-SHAPE 47X51 STRL (DRAPES) ×3 IMPLANT
DRSG OPSITE POSTOP 3X4 (GAUZE/BANDAGES/DRESSINGS) ×3 IMPLANT
DRSG OPSITE POSTOP 4X6 (GAUZE/BANDAGES/DRESSINGS) ×3 IMPLANT
DURAPREP 26ML APPLICATOR (WOUND CARE) ×3 IMPLANT
ELECT COATED BLADE 2.86 ST (ELECTRODE) ×3 IMPLANT
ELECT PENCIL ROCKER SW 15FT (MISCELLANEOUS) ×3 IMPLANT
ELECT REM PT RETURN 9FT ADLT (ELECTROSURGICAL) ×3
ELECTRODE REM PT RTRN 9FT ADLT (ELECTROSURGICAL) ×1 IMPLANT
ENDOSKELTON IMPLANT TC MED 8MM (Cage) ×6 IMPLANT
GLOVE SURG ENC MOIS LTX SZ6.5 (GLOVE) ×3 IMPLANT
GLOVE SURG MICRO LTX SZ8.5 (GLOVE) ×3 IMPLANT
GLOVE SURG UNDER POLY LF SZ6.5 (GLOVE) ×3 IMPLANT
GLOVE SURG UNDER POLY LF SZ8.5 (GLOVE) ×3 IMPLANT
GOWN STRL REUS W/ TWL LRG LVL3 (GOWN DISPOSABLE) ×1 IMPLANT
GOWN STRL REUS W/TWL 2XL LVL3 (GOWN DISPOSABLE) ×3 IMPLANT
GOWN STRL REUS W/TWL LRG LVL3 (GOWN DISPOSABLE) ×2
KIT BASIN OR (CUSTOM PROCEDURE TRAY) ×3 IMPLANT
KIT TURNOVER KIT B (KITS) ×3 IMPLANT
NEEDLE HYPO 22GX1.5 SAFETY (NEEDLE) ×3 IMPLANT
NEEDLE SPNL 18GX3.5 QUINCKE PK (NEEDLE) ×3 IMPLANT
NS IRRIG 1000ML POUR BTL (IV SOLUTION) ×3 IMPLANT
PACK ORTHO CERVICAL (CUSTOM PROCEDURE TRAY) ×3 IMPLANT
PACK UNIVERSAL I (CUSTOM PROCEDURE TRAY) ×3 IMPLANT
PAD ARMBOARD 7.5X6 YLW CONV (MISCELLANEOUS) ×6 IMPLANT
PATTIES SURGICAL .25X.25 (GAUZE/BANDAGES/DRESSINGS) ×3 IMPLANT
PIN DISTRACTION MAXCESS-C 14 (PIN) ×6 IMPLANT
PLATE ACP 1.6X42 2LVL (Plate) ×3 IMPLANT
POSITIONER HEAD DONUT 9IN (MISCELLANEOUS) ×3 IMPLANT
PUTTY BONE DBX 5CC MIX (Putty) ×3 IMPLANT
RESTRAINT LIMB HOLDER UNIV (RESTRAINTS) ×3 IMPLANT
SCREW ACP VA SD 3.5X15 (Screw) ×18 IMPLANT
SPONGE INTESTINAL PEANUT (DISPOSABLE) ×3 IMPLANT
SPONGE SURGIFOAM ABS GEL 100 (HEMOSTASIS) ×3 IMPLANT
SPONGE T-LAP 4X18 ~~LOC~~+RFID (SPONGE) ×6 IMPLANT
STRIP CLOSURE SKIN 1/2X4 (GAUZE/BANDAGES/DRESSINGS) ×4 IMPLANT
SURGIFLO W/THROMBIN 8M KIT (HEMOSTASIS) ×3 IMPLANT
SUT BONE WAX W31G (SUTURE) ×3 IMPLANT
SUT MNCRL AB 3-0 PS2 27 (SUTURE) ×3 IMPLANT
SUT SILK 2 0 (SUTURE)
SUT SILK 2-0 18XBRD TIE 12 (SUTURE) IMPLANT
SUT VIC AB 2-0 CT1 18 (SUTURE) ×6 IMPLANT
SYR BULB IRRIG 60ML STRL (SYRINGE) ×3 IMPLANT
SYR CONTROL 10ML LL (SYRINGE) ×3 IMPLANT
TAPE CLOTH 4X10 WHT NS (GAUZE/BANDAGES/DRESSINGS) ×3 IMPLANT
TAPE UMBILICAL COTTON 1/8X30 (MISCELLANEOUS) ×3 IMPLANT
TOWEL GREEN STERILE (TOWEL DISPOSABLE) ×3 IMPLANT
TOWEL GREEN STERILE FF (TOWEL DISPOSABLE) ×3 IMPLANT
WATER STERILE IRR 1000ML POUR (IV SOLUTION) ×3 IMPLANT

## 2020-12-11 NOTE — Brief Op Note (Signed)
12/11/2020  4:25 PM  PATIENT:  Hunter Swanson  60 y.o. male  PRE-OPERATIVE DIAGNOSIS:  Cervical spondylotic radiculopathy, left arm pain  POST-OPERATIVE DIAGNOSIS:  Cervical spondylotic radiculopathy, left arm pain  PROCEDURE:  Procedure(s): ANTERIOR CERVICAL DECOMPRESSION/DISCECTOMY FUSION 2 LEVELS (ACDF CERVICAL FIVE - CERVICAL SEVEN) (N/A)  SURGEON:  Surgeon(s) and Role:    Venita Lick, MD - Primary  PHYSICIAN ASSISTANT:   ASSISTANTS: Voncille Lo, PA   ANESTHESIA:   general  EBL:  40 mL   BLOOD ADMINISTERED:none  DRAINS: none   LOCAL MEDICATIONS USED:  MARCAINE     SPECIMEN:  No Specimen  DISPOSITION OF SPECIMEN:  N/A  COUNTS:  YES  TOURNIQUET:  * No tourniquets in log *  DICTATION: .Dragon Dictation  PLAN OF CARE: Admit for overnight observation  PATIENT DISPOSITION:  PACU - hemodynamically stable.

## 2020-12-11 NOTE — H&P (Signed)
Addendum H&P: There is been no change in the patient's clinical exam since his last office visit of 12/02/2020.  He continues to have significant neck and radicular left arm pain and weakness.  While he does have 4 level degenerative changes his most symptomatic levels are C5-6 and C6-7.  This correlates with his C6 and C7 radicular pain and weakness.  After discussing treatment options we elected to move forward with a two-level ACDF to address the clinically relevant C5-6 C6-7 spondylitic radiculopathy.  All appropriate risks, benefits, and alternatives to surgery were again reviewed with the patient and consent was obtained.

## 2020-12-11 NOTE — Anesthesia Postprocedure Evaluation (Signed)
Anesthesia Post Note  Patient: Hunter Swanson  Procedure(s) Performed: ANTERIOR CERVICAL DECOMPRESSION/DISCECTOMY FUSION 2 LEVELS (ACDF CERVICAL FIVE - CERVICAL SEVEN)     Patient location during evaluation: PACU Anesthesia Type: General Level of consciousness: sedated Pain management: pain level controlled Vital Signs Assessment: post-procedure vital signs reviewed and stable Respiratory status: spontaneous breathing and respiratory function stable Cardiovascular status: stable Postop Assessment: no apparent nausea or vomiting Anesthetic complications: no   No notable events documented.  Last Vitals:  Vitals:   12/11/20 1136 12/11/20 1635  BP: (!) 170/94 (!) 147/71  Pulse: 95 (!) 112  Resp: 18 12  Temp: 36.8 C 36.9 C  SpO2:  94%    Last Pain:  Vitals:   12/11/20 1635  TempSrc:   PainSc: 0-No pain                 Mellody Dance

## 2020-12-11 NOTE — Op Note (Signed)
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  C5-6, and C6-7.  POST-OPERATIVE DIAGNOSIS: Same  PROCEDURE:   Anterior cervical discectomy and fusion C5-7  SURGEON:  Venita Lick, MD  PHYSICIAN ASSISTANT: Voncille Lo, PA  ANESTHESIA:   General  EBL: 40 ml   Complications: None  Implants: Titan Nano lock intervertebral cage.  8 mm medium lordotic.  NuVasive ACP plate.  44 mm length plate with 15 mm locking screws  Graft: DBX mix  BRIEF HISTORY: Hunter Swanson is a 60 y.o. male who presented with significant radicular left arm pain.  Patient had both motor and sensory deficits in the C6 and C7 distribution.  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  As result of the failure of conservative management to alleviate his pain we elected to move forward with surgery.  All appropriate risks, benefits, and alternatives to surgery were discussed and consent was obtained  PROCEDURE DETAILS: Patient was brought into the operating room and was properly positioned on the operating room table.  After induction with general anesthesia the patient was endotracheally intubated.  A timeout was taken to confirm all important data: including patient, procedure, and the level. Teds, SCD's were applied.  Foley was also inserted.  Patient's arms were secured to the side and the anterior cervical spine was prepped and draped in a standard fashion.  Using fluoroscopy identified the C6 vertebral body and marked the space out on the skin.  I infiltrated this with quarter percent Marcaine with epinephrine and then made a transverse incision starting at the midline and proceeding over to the left.  Sharp dissection was carried out down to and through the platysma.   I performed a standard Smith-Robinson approach to the cervical spine.  I sharply dissected along the medial border the sternocleidomastoid until I was able to identify the omohyoid muscle.  Identified and isolated this muscle and then transected for improved visualization.  I continued to sharply dissect through the deep cervical and prevertebral fascia until I was able to mobilize the esophagus and trachea to the right and visualize and protect the carotid sheath on the left.  Using Kitner dissectors I continue to mobilize the prevertebral fascia until I completely expose the C5-6, and C6-7 disc spaces.  A needle was placed into the C5-6 disc space and a lateral x-ray was used to confirm that I was at the appropriate level.  Once this was confirmed I then marked the disc space with the Bovie and then mobilized the longus coli muscle from the midportion of the C5 vertebral body to the midportion of the C7 vertebral body.  Once I had the longus coli muscle mobilized bilaterally I placed the Caspar retracting blades underneath the longus coli muscle deflated the endotracheal cuff and expanded the retractor to the appropriate width.  Annulotomy was performed with a 15 blade scalpel and the bulk of the disc material was then removed with a pituitary rondure.  The overhanging osteophyte from the inferior aspect of the C5 vertebral body was taken down with a 2 mm Kerrison rongeur.  Curettes and Kerrison rongeurs were used to remove the remaining disc material and expose the subchondral bone.  Once I removed all the disc material I placed distraction pins into the C5 and C6 vertebral  body and then distracted the intervertebral space and maintained it with the distraction pin set.  I then used a fine nerve hook to dissect underneath the posterior annulus and I resected this with a 1 mm Kerrison rongeur.  This allowed me to resect the posterior osteophyte from the vertebral bodies of the C5 and 6 vertebral bodies.  I now  can easily pass my nerve hook under the uncovertebral joint.  I confirmed that I had adequate decompression by taking imaging studies confirming that my nerve hook was underneath the uncovertebral joint and clearly decompress the nerve root.  At this point I was pleased with the overall decompression and had bleeding subchondral bone.  I used a rasp trial to confirm the 8 mm medium cage was the appropriate size.  I irrigated the wound made sure had bleeding subchondral bone and then obtained the implant and packed it with the allograft.  The bone graft was then [facility] to the appropriate depth.  Proper position was confirmed with fluoroscopy.  The distraction pin was removed from the body of C5 and the resulting bone hole was plugged with bone wax  I reposition the retractors to expose the C6-7 disc space and using the exact same technique I performed a discectomy at this level.  I again took down the osteophyte posteriorly using my 1 mm Kerrison rongeur and made sure I can get underneath the uncovertebral joint and resect the osteophyte and decompress the nerve root.  I again confirmed with fluoroscopy that my nerve hook was clearly under the uncovertebral joint and adequately decompressing the foramen.  I made sure had bleeding subchondral bone and removed all the cartilaginous endplate.  At this point the 8 mm medium trial was placed and felt to be a good fit.  I then obtained the cage and packed it with the allograft.  The cage was then [facility] to the appropriate depth.   The wound was then copiously irrigated with normal saline and I made sure hemostasis using bipolar electrocautery.  The anterior cervical plate was then obtained and contoured reduction of the fracture site and then secured to the vertebral body with 15 mm locking screws.  All screws had excellent purchase.  Once I confirmed satisfactory position of the plate and the screws in both the AP and lateral planes I then locked the screws to the  plate according manufacture standards.  After final irrigation all retractors were removed and the trach and esophagus were returned to midline.  Hemostasis was confirmed using proper electrocautery.  Platysma was reapproximated with interrupted 2-0 Vicryl suture and the skin with 3-0 Monocryl.  Steri-Strips and dry dressings were applied and the patient was ultimately extubated transfer the PACU without incident.  The end of the case all needle sponge counts were correct.  First Assistant: Voncille Lo, PA: She was instrumental in assisting with positioning the patient as well as retraction, suction for visualization, assisting with implantation of the hardware as well as wound closure.  Venita Lick, MD 12/11/2020 4:14 PM

## 2020-12-11 NOTE — Progress Notes (Signed)
Pharmacy Antibiotic Note  Hunter Swanson is a 60 y.o. male admitted on 12/11/2020 with surgical prophylaxis.  Pharmacy has been consulted for Vancomycin dosing.  Drain in place  Plan: Vancomycin 1250 mg iv Q 12 hours  DC vancomycin once drain removed  Height: 6\' 3"  (190.5 cm) Weight: 136.1 kg (300 lb) IBW/kg (Calculated) : 84.5  Temp (24hrs), Avg:98.4 F (36.9 C), Min:98.2 F (36.8 C), Max:98.5 F (36.9 C)  Recent Labs  Lab 12/09/20 1114  WBC 8.4  CREATININE 1.13    Estimated Creatinine Clearance: 100.8 mL/min (by C-G formula based on SCr of 1.13 mg/dL).    Allergies  Allergen Reactions   Penicillins Rash and Other (See Comments)   Neurontin [Gabapentin] Rash       Thank you for allowing pharmacy to be a part of this patient's care.  12/11/20 12/11/2020 5:44 PM

## 2020-12-11 NOTE — Anesthesia Preprocedure Evaluation (Addendum)
Anesthesia Evaluation  Patient identified by MRN, date of birth, ID band Patient awake    Reviewed: Allergy & Precautions, NPO status , Patient's Chart, lab work & pertinent test results  Airway Mallampati: II  TM Distance: >3 FB Neck ROM: Full    Dental no notable dental hx.    Pulmonary sleep apnea and Continuous Positive Airway Pressure Ventilation ,    Pulmonary exam normal breath sounds clear to auscultation       Cardiovascular hypertension, Pt. on medications Normal cardiovascular exam Rhythm:Regular Rate:Normal  Normal sinus rhythm Incomplete left bundle branch block Nonspecific ST and T wave abnormality Abnormal ECG No significant change since last tracing Confirmed by Verdis Prime [phone]) on 12/09/2020 9:36:28 PM   Neuro/Psych Anxiety  Neuromuscular disease    GI/Hepatic negative GI ROS, Neg liver ROS,   Endo/Other  negative endocrine ROS  Renal/GU negative Renal ROS  negative genitourinary   Musculoskeletal  (+) Arthritis ,   Abdominal   Peds negative pediatric ROS (+)  Hematology negative hematology ROS (+)   Anesthesia Other Findings   Reproductive/Obstetrics negative OB ROS                            Anesthesia Physical Anesthesia Plan  ASA: 2  Anesthesia Plan: General   Post-op Pain Management:    Induction: Intravenous  PONV Risk Score and Plan: Ondansetron, Dexamethasone, Midazolam and Treatment may vary due to age or medical condition  Airway Management Planned: Oral ETT and Video Laryngoscope Planned  Additional Equipment: None  Intra-op Plan:   Post-operative Plan: Extubation in OR  Informed Consent: I have reviewed the patients History and Physical, chart, labs and discussed the procedure including the risks, benefits and alternatives for the proposed anesthesia with the patient or authorized representative who has indicated his/her understanding and  acceptance.     Dental advisory given  Plan Discussed with: CRNA, Anesthesiologist and Surgeon  Anesthesia Plan Comments: (GETA. Glidescope for intubation. 2 PIV. Tanna Furry, MD  )       Anesthesia Quick Evaluation

## 2020-12-11 NOTE — Discharge Instructions (Signed)

## 2020-12-11 NOTE — Anesthesia Procedure Notes (Signed)
Procedure Name: Intubation Date/Time: 12/11/2020 1:20 PM Performed by: Michele Rockers, CRNA Pre-anesthesia Checklist: Patient identified, Patient being monitored, Timeout performed, Emergency Drugs available and Suction available Patient Re-evaluated:Patient Re-evaluated prior to induction Oxygen Delivery Method: Circle System Utilized Preoxygenation: Pre-oxygenation with 100% oxygen Induction Type: IV induction and Rapid sequence Ventilation: Mask ventilation without difficulty Laryngoscope Size: Glidescope, Mac and 4 Grade View: Grade I Tube type: Oral Tube size: 8.0 mm Number of attempts: 1 Airway Equipment and Method: Stylet Placement Confirmation: ETT inserted through vocal cords under direct vision, positive ETCO2 and breath sounds checked- equal and bilateral Secured at: 23 cm Tube secured with: Tape Dental Injury: Teeth and Oropharynx as per pre-operative assessment  Difficulty Due To: Difficult Airway-  due to neck instability, Difficult Airway- due to reduced neck mobility and Difficult Airway- due to large tongue

## 2020-12-11 NOTE — Transfer of Care (Signed)
Immediate Anesthesia Transfer of Care Note  Patient: Hunter Swanson  Procedure(s) Performed: ANTERIOR CERVICAL DECOMPRESSION/DISCECTOMY FUSION 2 LEVELS (ACDF CERVICAL FIVE - CERVICAL SEVEN)  Patient Location: PACU  Anesthesia Type:General  Level of Consciousness: awake, alert  and oriented  Airway & Oxygen Therapy: Patient Spontanous Breathing  Post-op Assessment: Report given to RN and Patient moving all extremities X 4  Post vital signs: Reviewed and stable  Last Vitals:  Vitals Value Taken Time  BP 147/71   Temp    Pulse 112 12/11/20 1635  Resp 12 12/11/20 1635  SpO2 94 % 12/11/20 1635  Vitals shown include unvalidated device data.  Last Pain:  Vitals:   12/11/20 1136  TempSrc: Oral      Patients Stated Pain Goal: 0 (12/11/20 1204)  Complications: No notable events documented.

## 2020-12-12 ENCOUNTER — Encounter (HOSPITAL_COMMUNITY): Payer: Self-pay | Admitting: Orthopedic Surgery

## 2020-12-12 DIAGNOSIS — M5412 Radiculopathy, cervical region: Secondary | ICD-10-CM | POA: Diagnosis not present

## 2020-12-12 NOTE — Evaluation (Signed)
Physical Therapy Evaluation Patient Details Name: Hunter Swanson MRN: [id number] DOB: November 05, 1960 Today's Date: 12/12/2020  History of Present Illness  60 y.o. male s/p ACDF C5-7 on 11/16. 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   Clinical Impression  Pt in bed upon arrival of PT, agreeable to evaluation at this time. Prior to admission the pt was completely independent with mobility, still working full time and working out until a few weeks ago. The pt now presents with minor limitations in functional mobility and activity tolerance following surgery, but was able to demo good stability with all transfers, gait, and stair navigation. The pt was educated on cervical precautions and then able to apply well to mobility. No further acute PT needs, but the pt is hopefull to return to full activity and work schedule once cleared, therefore I agree with pt/MD plan to pursue OPPT once cleared.     Recommendations for follow up therapy are one component of a multi-disciplinary discharge planning process, led by the attending physician.  Recommendations may be updated based on patient status, additional functional criteria and insurance authorization.  Follow Up Recommendations Follow physician's recommendations for discharge plan and follow up therapies (pt discussed OPPT with surgeon prior to surgery and I agree this will be helpful to facilitate return to full activity when cleared by MD)    Assistance Recommended at Discharge Intermittent Supervision/Assistance  Functional Status Assessment Patient has not had a recent decline in their functional status  Equipment Recommendations  None recommended by PT    Recommendations for Other Services       Precautions / Restrictions Precautions Precautions: Cervical Precaution Booklet Issued: Yes (comment) Required Braces or Orthoses: Cervical Brace Cervical Brace: Hard collar;At all  times Restrictions Weight Bearing Restrictions: No      Mobility  Bed Mobility Overal bed mobility: Needs Assistance Bed Mobility: Sit to Sidelying;Sidelying to Sit;Rolling Rolling: Supervision Sidelying to sit: Supervision;HOB elevated     Sit to sidelying: Supervision;HOB elevated General bed mobility comments: utilized log rolling technique    Transfers Overall transfer level: Modified independent Equipment used: None               General transfer comment: steady without UE support    Ambulation/Gait Ambulation/Gait assistance: Modified independent (Device/Increase time) Gait Distance (Feet): 350 Feet Assistive device: None Gait Pattern/deviations: Step-through pattern;Decreased stride length Gait velocity: 0.66 m/s Gait velocity interpretation: 1.31 - 2.62 ft/sec, indicative of limited community ambulator   General Gait Details: pt with slightly slowed gait, no LOB or need for UE support  Stairs Stairs: Yes Stairs assistance: Supervision Stair Management: One rail Right;Alternating pattern;Forwards Number of Stairs: 4 General stair comments: steady but dependent on UE support      Balance Overall balance assessment: Mild deficits observed, not formally tested (pt with slight wide BOS but able to manage dynamic SLS and turning without LOB)                                           Pertinent Vitals/Pain Pain Assessment: No/denies pain    Home Living Family/patient expects to be discharged to:: Private residence Living Arrangements: Spouse/significant other Available Help at Discharge: Family Type of Home: House Home Access: Stairs to enter   [employer] of Steps: 3-4     Home Equipment: Civil engineer, contracting  Prior Function Prior Level of Function : Independent/Modified Independent;Working/employed               ADLs Comments: office job, 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 bed  at d/c     Hand Dominance        Extremity/Trunk Assessment   Upper Extremity Assessment Upper Extremity Assessment: Defer to OT evaluation    Lower Extremity Assessment Lower Extremity Assessment: Overall WFL for tasks assessed    Cervical / Trunk Assessment Cervical / Trunk Assessment: Neck Surgery  Communication   Communication: No difficulties  Cognition Arousal/Alertness: Awake/alert Behavior During Therapy: WFL for tasks assessed/performed Overall Cognitive Status: Within Functional Limits for tasks assessed                                          General Comments General comments (skin integrity, edema, etc.): VSS on RA        Assessment/Plan    PT Assessment All further PT needs can be met in the next venue of care (pt discussed OPPT with surgeon prior to surgery)  PT Problem List Decreased strength;Decreased range of motion;Impaired sensation           PT Goals (Current goals can be found in the Care Plan section)  Acute Rehab PT Goals Patient Stated Goal: return to gym and work PT Goal Formulation: With patient Time For Goal Achievement: 12/26/20 Potential to Achieve Goals: Good     AM-PAC PT "6 Clicks" Mobility  Outcome Measure Help needed turning from your back to your side while in a flat bed without using bedrails?: None Help needed moving from lying on your back to sitting on the side of a flat bed without using bedrails?: None Help needed moving to and from a bed to a chair (including a wheelchair)?: None Help needed standing up from a chair using your arms (e.g., wheelchair or bedside chair)?: None Help needed to walk in hospital room?: None Help needed climbing 3-5 steps with a railing? : A Little 6 Click Score: 23    End of Session Equipment Utilized During Treatment: Gait belt;Cervical collar Activity Tolerance: Patient tolerated treatment well Patient left: in bed;with call bell/phone within reach Nurse  Communication: Mobility status (pt ready for d/c) PT Visit Diagnosis: Other abnormalities of gait and mobility (R26.89);Muscle weakness (generalized) (M62.81);Pain    Time: 0947-0962 PT Time Calculation (min) (ACUTE ONLY): 10 min   Charges:   PT Evaluation $PT Eval Low Complexity: 1 Low          West Carbo, PT, DPT   Acute Rehabilitation Department Pager #: [phone]  Sandra Cockayne 12/12/2020, 9:55 AM

## 2020-12-12 NOTE — Evaluation (Signed)
Occupational Therapy Evaluation Patient Details Name: Hunter Swanson MRN: [id number] DOB: 1960-04-21 Today's Date: 12/12/2020   History of Present Illness 60 y.o. male s/p ACDF C5-7 on 11/16. 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   Clinical Impression   Pt independent with ADLs and functional mobility at baseline, lives with family who will be available for assistance at d/c. Pt  supervision for most ADLs and bed mobility, Min A for UB dressing/bathing to adhere to cervical precautions. Educated pt on cervical precautions, log rolling technique, and compensatory strategies for LB dressing and bathing, pt verbalized and demonstrated understanding. Pt mod I in room for all transfers, requiring increased time. Pt limited by decreased ROM, activity tolerance, and balance at this time, however has no acute OT needs. Will s/o, recommend d/c home with family assistance.     Recommendations for follow up therapy are one component of a multi-disciplinary discharge planning process, led by the attending physician.  Recommendations may be updated based on patient status, additional functional criteria and insurance authorization.   Follow Up Recommendations  No OT follow up    Assistance Recommended at Discharge Intermittent Supervision/Assistance  Functional Status Assessment  Patient has had a recent decline in their functional status and demonstrates the ability to make significant improvements in function in a reasonable and predictable amount of time.  Equipment Recommendations  None recommended by OT    Recommendations for Other Services PT consult     Precautions / Restrictions Precautions Precautions: Cervical Precaution Booklet Issued: Yes (comment) Required Braces or Orthoses: Cervical Brace Cervical Brace: Hard collar;At all times Restrictions Weight Bearing Restrictions: No      Mobility Bed Mobility Overal bed mobility:  Needs Assistance Bed Mobility: Sit to Sidelying;Sidelying to Sit;Rolling Rolling: Supervision Sidelying to sit: Supervision;HOB elevated     Sit to sidelying: Supervision;HOB elevated General bed mobility comments: utilized log rolling technique    Transfers Overall transfer level: Modified independent Equipment used: None                      Balance Overall balance assessment: Mild deficits observed, not formally tested                                         ADL either performed or assessed with clinical judgement   ADL Overall ADL's : Needs assistance/impaired Eating/Feeding: Set up;Sitting   Grooming: Set up;Sitting   Upper Body Bathing: Minimal assistance;Sitting (adhering to cervical precautions)   Lower Body Bathing: Supervison/ safety;Sitting/lateral leans   Upper Body Dressing : Supervision/safety (adhering to cervical precautions) Upper Body Dressing Details (indicate cue type and reason): demonstrates ability to don/doff brace in front of mirror Lower Body Dressing: Minimal assistance;Sit to/from stand   Toilet Transfer: Supervision/safety;Ambulation   Toileting- Clothing Manipulation and Hygiene: Supervision/safety;Sit to/from stand   Tub/ Engineer, structural: Walk-in shower;Ambulation   Functional mobility during ADLs: Supervision/safety       Vision   Vision Assessment?: No apparent visual deficits     Perception     Praxis      Pertinent Vitals/Pain Pain Assessment: No/denies pain     Hand Dominance     Extremity/Trunk Assessment Upper Extremity Assessment Upper Extremity Assessment: Overall WFL for tasks assessed (while adhering to cervical precuations)   Lower Extremity Assessment Lower Extremity Assessment: Defer to PT evaluation  Cervical / Trunk Assessment Cervical / Trunk Assessment: Neck Surgery   Communication Communication Communication: No difficulties   Cognition Arousal/Alertness:  Awake/alert Behavior During Therapy: WFL for tasks assessed/performed Overall Cognitive Status: Within Functional Limits for tasks assessed                                       General Comments       Exercises     Shoulder Instructions      Home Living Family/patient expects to be discharged to:: Private residence Living Arrangements: Spouse/significant other Available Help at Discharge: Family Type of Home: House Home Access: Stairs to enter Secretary/administrator of Steps: 3-4         Bathroom Shower/Tub: Producer, television/film/video: Handicapped height Bathroom Accessibility: No   Home Equipment: Shower seat          Prior Functioning/Environment Prior Level of Function : Independent/Modified Independent;Working/employed               ADLs Comments: office job, 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 bed at d/c        OT Problem List: Decreased range of motion;Decreased activity tolerance;Decreased knowledge of precautions      OT Treatment/Interventions:      OT Goals(Current goals can be found in the care plan section) Acute Rehab OT Goals Patient Stated Goal: return to PLOF OT Goal Formulation: With patient Time For Goal Achievement: 12/26/20 Potential to Achieve Goals: Good  OT Frequency:     Barriers to D/C:            Co-evaluation              AM-PAC OT "6 Clicks" Daily Activity     Outcome Measure Help from another person eating meals?: None Help from another person taking care of personal grooming?: None Help from another person toileting, which includes using toliet, bedpan, or urinal?: None Help from another person bathing (including washing, rinsing, drying)?: A Little Help from another person to put on and taking off regular upper body clothing?: A Little Help from another person to put on and taking off regular lower body clothing?: A Little 6 Click Score: 21   End of  Session Equipment Utilized During Treatment: Gait belt Nurse Communication: Mobility status  Activity Tolerance: Patient tolerated treatment well Patient left: in bed;with call bell/phone within reach  OT Visit Diagnosis: Unsteadiness on feet (R26.81);Other abnormalities of gait and mobility (R26.89);Muscle weakness (generalized) (M62.81);Pain                Time: 0762-2633 OT Time Calculation (min): 15 min Charges:  OT General Charges $OT Visit: 1 Visit OT Evaluation $OT Eval Low Complexity: 1 Low  Alfonzo Beers, OTD, OTR/L Acute Rehab [phone]   Mayer Masker 12/12/2020, 8:50 AM

## 2020-12-12 NOTE — Progress Notes (Signed)
Subjective: 1 Day Post-Op Procedure(s) (LRB): ANTERIOR CERVICAL DECOMPRESSION/DISCECTOMY FUSION 2 LEVELS (ACDF CERVICAL FIVE - CERVICAL SEVEN) (N/A) Patient reports pain as mild.  Radicular arm pain improved. No significant dysphagia. Tolerating PO without nausea or vomiting.  +void +flatus +ambulation No complains. Eager to go home.   Objective: Vital signs in last 24 hours: Temp:  [97.7 F (36.5 C)-98.9 F (37.2 C)] 97.7 F (36.5 C) (11/17 0720) Pulse Rate:  [87-112] 87 (11/17 0720) Resp:  [12-20] 18 (11/17 0720) BP: (147-170)/(68-94) 157/84 (11/17 0720) SpO2:  [94 %-98 %] 98 % (11/17 0720) Weight:  [136.1 kg] 136.1 kg (11/16 1136)  Intake/Output from previous day: 11/16 0701 - 11/17 0700 In: 1000 [I.V.:1000] Out: 590 [Urine:550; Blood:40] Intake/Output this shift: No intake/output data recorded.  Recent Labs    12/09/20 1114  HGB 17.3*   Recent Labs    12/09/20 1114  WBC 8.4  RBC 5.34  HCT 50.5  PLT 259   Recent Labs    12/09/20 1114  NA 138  K 4.2  CL 100  CO2 29  BUN 24*  CREATININE 1.13  GLUCOSE 91  CALCIUM 9.4   Recent Labs    12/09/20 1114  INR 1.0    Neurologically intact ABD soft Neurovascular intact Sensation intact distally Intact pulses distally Dorsiflexion/Plantar flexion intact Incision: dressing C/D/I Compartment soft   Assessment/Plan: 1 Day Post-Op Procedure(s) (LRB): ANTERIOR CERVICAL DECOMPRESSION/DISCECTOMY FUSION 2 LEVELS (ACDF CERVICAL FIVE - CERVICAL SEVEN) (N/A) Advance diet Up with therapy Aspen collar when OOB Encouraged IS DVT ppx: Teds, SCDs, ambulation  Plan D/C today.    Rhodia Albright 12/12/2020, 9:24 AM

## 2020-12-12 NOTE — Discharge Summary (Signed)
Patient ID: Hunter Swanson MRN: [id number] DOB/AGE: Dec 21, 1960 60 y.o.  Admit date: 12/11/2020 Discharge date: 12/12/2020  Admission Diagnoses:  Active Problems:   Cervical radiculopathy   Discharge Diagnoses:  Active Problems:   Cervical radiculopathy  status post Procedure(s): ANTERIOR CERVICAL DECOMPRESSION/DISCECTOMY FUSION 2 LEVELS (ACDF CERVICAL FIVE - CERVICAL SEVEN)  Past Medical History:  Diagnosis Date   Anxiety    Baker's cyst of knee, left    Small   Chronic pain of left knee    History of retinal detachment    right eye s/p repair 03-18-2016   Hypertension    Left leg weakness    secondary to left hip   Neuromuscular disorder (HCC)    neuropathy   OA (osteoarthritis)    knees, hips   Pneumonia    Sleep apnea    use CPAP   Spinal stenosis, lumbar    Wears contact lenses     Surgeries: Procedure(s): ANTERIOR CERVICAL DECOMPRESSION/DISCECTOMY FUSION 2 LEVELS (ACDF CERVICAL FIVE - CERVICAL SEVEN) on 12/11/2020   Consultants:   Discharged Condition: Improved  Hospital Course: LETCHER SCHWEIKERT is an 60 y.o. male who was admitted 12/11/2020 for operative treatment of degenerative disc disease of cervical spine with cervical radiculopathy. Patient failed conservative treatments (please see the history and physical for the specifics) and had severe unremitting pain that affects sleep, daily activities and work/hobbies. After pre-op clearance, the patient was taken to the operating room on 12/11/2020 and underwent  Procedure(s): ANTERIOR CERVICAL DECOMPRESSION/DISCECTOMY FUSION 2 LEVELS (ACDF CERVICAL FIVE - CERVICAL SEVEN).    Patient was given perioperative antibiotics:  Anti-infectives (From admission, onward)    Start     Dose/Rate Route Frequency Ordered Stop   12/12/20 0000  vancomycin (VANCOREADY) IVPB 1250 mg/250 mL        1,250 mg 166.7 mL/hr over 90 Minutes Intravenous Every 12 hours 12/11/20 1743     12/11/20 1134  vancomycin (VANCOREADY)  IVPB 1500 mg/300 mL        1,500 mg 150 mL/hr over 120 Minutes Intravenous 120 min pre-op 12/11/20 1134 12/11/20 1415        Patient was given sequential compression devices and early ambulation to prevent DVT.   Patient benefited maximally from hospital stay and there were no complications. At the time of discharge, the patient was urinating/moving their bowels without difficulty, tolerating a regular diet, pain is controlled with oral pain medications and they have been cleared by PT/OT.   Recent vital signs: Patient Vitals for the past 24 hrs:  BP Temp Temp src Pulse Resp SpO2  12/12/20 0720 (!) 157/84 97.7 F (36.5 C) Oral 87 18 98 %  12/12/20 0337 (!) 156/81 98 F (36.7 C) Oral 89 20 96 %  12/11/20 2308 (!) 156/84 98.9 F (37.2 C) Oral (!) 106 20 97 %  12/11/20 1913 (!) 159/71 98.2 F (36.8 C) Oral (!) 108 20 96 %  12/11/20 1720 (!) 149/79 -- -- (!) 108 18 97 %  12/11/20 1705 (!) 148/73 -- -- (!) 109 18 96 %  12/11/20 1650 (!) 149/68 -- -- (!) 106 16 96 %  12/11/20 1635 (!) 147/71 98.5 F (36.9 C) -- (!) 112 12 94 %     Recent laboratory studies: No results for input(s): WBC, HGB, HCT, PLT, NA, K, CL, CO2, BUN, CREATININE, GLUCOSE, INR, CALCIUM in the last 72 hours.  Invalid input(s): PT, 2   Discharge Medications:   Allergies as of 12/12/2020  Reactions   Penicillins Rash, Other (See Comments)   Neurontin [gabapentin] Rash        Medication List     STOP taking these medications    Diclofenac Sodium CR 100 MG 24 hr tablet   ibuprofen 200 MG tablet Commonly known as: ADVIL   TURMERIC PO   VITAMIN D PO       TAKE these medications    amitriptyline 100 MG tablet Commonly known as: ELAVIL Take 200 mg by mouth at bedtime.   folic acid 1 MG tablet Commonly known as: FOLVITE Take 1 mg by mouth daily.   losartan-hydrochlorothiazide 100-25 MG tablet Commonly known as: HYZAAR Take 1 tablet by mouth daily.   methocarbamol 500 MG  tablet Commonly known as: Robaxin Take 1 tablet (500 mg total) by mouth every 8 (eight) hours as needed for up to 5 days for muscle spasms.   ondansetron 4 MG tablet Commonly known as: Zofran Take 1 tablet (4 mg total) by mouth every 8 (eight) hours as needed for nausea or vomiting.   oxyCODONE-acetaminophen 10-325 MG tablet Commonly known as: Percocet Take 1 tablet by mouth every 6 (six) hours as needed for up to 5 days for pain.   venlafaxine XR 75 MG 24 hr capsule Commonly known as: EFFEXOR-XR Take 75 mg by mouth daily with breakfast.        Diagnostic Studies: DG Cervical Spine 2 or 3 views  Result Date: 12/11/2020 CLINICAL DATA:  Cervical fusion EXAM: CERVICAL SPINE - 2-3 VIEW COMPARISON:  None. FLUOROSCOPY TIME:  Radiation Exposure Index (as provided by the fluoroscopic device): 45.06 mGy If the device does not provide the exposure index: Fluoroscopy Time:  1 minute 32 seconds Number of Acquired Images:  6 FINDINGS: Multiple spot films were obtained and reveal interbody fusion at C5-6 and C6-7. No soft tissue abnormality is noted. No acute bony abnormality is seen. IMPRESSION: Cervical fusion from C5-C7. Electronically Signed   By: Alcide Clever M.D.   On: 12/11/2020 16:20   DG C-Arm 1-60 Min-No Report  Result Date: 12/11/2020 Fluoroscopy was utilized by the requesting physician.  No radiographic interpretation.   DG C-Arm 1-60 Min-No Report  Result Date: 12/11/2020 Fluoroscopy was utilized by the requesting physician.  No radiographic interpretation.   DG C-Arm 1-60 Min-No Report  Result Date: 12/11/2020 Fluoroscopy was utilized by the requesting physician.  No radiographic interpretation.    Discharge Instructions     Incentive spirometry RT   Complete by: As directed         Follow-up Information     Venita Lick, MD Follow up in 2 week(s).   Specialty: Orthopedic Surgery Why: As needed, If symptoms worsen, For suture removal, For wound re-check Contact  information: [address] [place] [address] [phone]                 Discharge Plan:  discharge to home  Disposition: stable    Signed: Rhodia Albright for [facility] PA-C [facility] [phone] 12/12/2020, 2:28 PM

## 2020-12-12 NOTE — Plan of Care (Signed)
Patient alert and oriented, mae's well, voiding adequate amount of urine, swallowing without difficulty, no c/o pain at time of discharge. Patient discharged home with family. Script and discharged instructions given to patient. Patient and family stated understanding of instructions given. Patient has an appointment with Dr. Brooks in 2 weeks 

## 2021-12-07 ENCOUNTER — Ambulatory Visit
Admission: EM | Admit: 2021-12-07 | Discharge: 2021-12-07 | Disposition: A | Discharge status: Home / Self Care | Payer: Managed Care, Other (non HMO) | Attending: Family Medicine | Admitting: Family Medicine

## 2021-12-07 ENCOUNTER — Encounter: Payer: Self-pay | Admitting: Emergency Medicine

## 2021-12-07 DIAGNOSIS — L03012 Cellulitis of left finger: Secondary | ICD-10-CM

## 2021-12-07 MED ORDER — DOXYCYCLINE HYCLATE 100 MG PO TABS: 100.0000 mg | ORAL_TABLET | Freq: Two times a day (BID) | ORAL | 0 refills | Status: DC

## 2021-12-07 MED ORDER — MUPIROCIN 2 % EX OINT: 1.0000 | TOPICAL_OINTMENT | Freq: Three times a day (TID) | CUTANEOUS | 0 refills | Status: AC

## 2021-12-07 NOTE — ED Triage Notes (Signed)
Patient states that he started having swelling, redness and  pain in his left 2nd finger on Friday.  Patient denies fevers.  Patient denies injury or fall.

## 2021-12-07 NOTE — ED Provider Notes (Signed)
[facility]    CSN: [id number] Arrival date & time: 12/07/21  1045      History   Chief Complaint Chief Complaint  Patient presents with   Finger Pain     Left 2nd finger    HPI  61 year old male presents for evaluation of the above.  Started on Friday.  He does not recall any fall, trauma, injury.  He states that he is having redness, swelling, and pain of his left second digit particularly at the DIP joint.  He has attempted to drain the area and states that he had pus come out when he used a needle to drain it.  Still continues to be red, hot, and painful.  No fever.  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   Pain in joint of right hip 05/03/2018   Osteoarthritis of knee 11/18/2017   History of revision of total replacement of left hip joint 08/17/2017   OA (osteoarthritis) of hip 08/02/2017   Osteoarthritis of left knee 07/02/2017   Arthritis of left hip 06/30/2017   S/P lumbar spinal fusion 06/30/2017   Spinal stenosis of lumbar region 03/03/2017   Lumbar radiculopathy 02/24/2017   S/P left knee arthroscopy 01/21/2017   Acute lateral meniscus tear of left knee 12/31/2016   Chronic pain of left knee 11/10/2016   Cervical pain (neck) 06/01/2014   Shoulder joint pain 06/01/2014    Past Surgical History:  Procedure Laterality Date   ANTERIOR CERVICAL DECOMP/DISCECTOMY FUSION N/A 12/11/2020   Procedure:  ANTERIOR CERVICAL DECOMPRESSION/DISCECTOMY FUSION 2 LEVELS (ACDF CERVICAL FIVE - CERVICAL SEVEN);  Surgeon: Venita Lick, MD;  Location[facility] OR;  Service: Orthopedics;  Laterality: N/A;   CERVICAL SPINE SURGERY  1997   bone spur removal due to L UE radicular symptoms   EYE SURGERY Right    retinel detachment   INCISION AND DRAINAGE COMPLEX POSTOPERATIVE WOUND INFECTION, BACK  05-13-2017  @ [facility]   KNEE ARTHROSCOPY Left x6 last one 12/ 2018   LUMBAR FUSION  2008   L3-4   POSTERIOR LUMBAR FUSION  04-12-2017   @ [facility]   L2-3, L5-S1 OLIF and posterior decompression; revision L2-S1 PSIF   SHOULDER ARTHROSCOPY W/ SUBACROMIAL DECOMPRESSION AND DISTAL CLAVICLE EXCISION Left 08/02/2014   TOTAL HIP ARTHROPLASTY Left 08/02/2017   Procedure: LEFT TOTAL HIP ARTHROPLASTY ANTERIOR APPROACH;  Surgeon: Ollen Gross, MD;  Location: [facility] ORS;  Service: Orthopedics;  Laterality: Left;   TOTAL HIP ARTHROPLASTY Right 07/13/2018   Procedure: TOTAL HIP ARTHROPLASTY ANTERIOR APPROACH;  Surgeon: Ollen Gross, MD;  Location: [facility] ORS;  Service: Orthopedics;  Laterality: Right;    VITRECTOMY Right 03/18/2016       Home Medications    Prior to Admission medications   Medication Sig Start Date End  Date Taking? Authorizing Provider  amitriptyline (ELAVIL) 100 MG tablet Take 200 mg by mouth at bedtime.   Yes [provider]  doxycycline (VIBRA-TABS) 100 MG tablet Take 1 tablet (100 mg total) by mouth 2 (two) times daily. 12/07/21  Yes Anshika Pethtel G, DO  folic acid (FOLVITE) 1 MG tablet Take 1 mg by mouth daily. 11/29/20  Yes [provider]  losartan-hydrochlorothiazide (HYZAAR) 100-25 MG tablet Take 1 tablet by mouth daily.   Yes [provider]  mupirocin ointment (BACTROBAN) 2 % Apply 1 Application topically 3 (three) times daily for 7 days. 12/07/21 12/14/21 Yes Jaliana Medellin G, DO  venlafaxine XR (EFFEXOR-XR) 75 MG 24 hr capsule Take 75 mg by mouth daily with breakfast.   Yes  [provider]  ondansetron (ZOFRAN) 4 MG tablet Take 1 tablet (4 mg total) by mouth every 8 (eight) hours as needed for nausea or vomiting. 12/11/20   Venita Lick, MD   Social History Social History   Tobacco Use   Smoking status: Never   Smokeless tobacco: Never  Vaping Use   Vaping Use: Never used  Substance Use Topics   Alcohol use: Yes    Comment: rare   Drug use: Never     Allergies   Penicillins and Neurontin [gabapentin]   Review of Systems Review of Systems Per HPI  Physical Exam Triage Vital Signs ED Triage Vitals  Enc Vitals Group     BP 12/07/21 1104 (!) 149/89     Pulse Rate 12/07/21 1104 83     Resp 12/07/21 1104 15     Temp 12/07/21 1104 97.6 F (36.4 C)     Temp Source 12/07/21 1104 Oral     SpO2 12/07/21 1104 98 %     Weight 12/07/21 1102 287 lb (130.2 kg)     Height 12/07/21 1102 6\' 3"  (1.905 m)     Head Circumference --      Peak Flow --      Pain Score 12/07/21 1102 3     Pain Loc --      Pain Edu? --      Excl. in GC? --    Updated Vital Signs BP (!) 149/89 (BP Location: Left Arm)   Pulse 83   Temp 97.6 F (36.4 C) (Oral)   Resp 15   Ht 6\' 3"  (1.905 m)   Wt 130.2 kg   SpO2 98%   BMI 35.87 kg/m   Visual Acuity Right Eye Distance:   Left Eye Distance:   Bilateral Distance:    Right Eye Near:   Left Eye Near:    Bilateral Near:     Physical Exam Vitals and nursing note reviewed.  Constitutional:      General: He is not in acute distress.    Appearance: Normal appearance.  HENT:     Head: Normocephalic and atraumatic.  Cardiovascular:     Rate and Rhythm: Normal rate and regular rhythm.  Pulmonary:     Effort: Pulmonary effort is normal.     Breath sounds: Normal breath sounds.  Musculoskeletal:     Comments: Left second digit with erythema, warmth, and tenderness just below the nailbed.  Neurological:     Mental Status: He is alert.  Psychiatric:        Mood and Affect: Mood normal.        Behavior:  Behavior normal.      UC Treatments / Results  Labs (all labs ordered are listed, but  only abnormal results are displayed) Labs Reviewed - No data to display  EKG   Radiology No results found.  Procedures Procedures (including critical care time)  Medications Ordered in UC Medications - No data to display  Initial Impression / Assessment and Plan / UC Course  I have reviewed the triage vital signs and the nursing notes.  Pertinent labs & imaging results that were available during my care of the patient were reviewed by me and considered in my medical decision making (see chart for details).    61 year old male presents with paronychia.  Treating with Bactroban ointment and doxycycline.  There was no appreciable area to drain.  Final Clinical Impressions(s) / UC Diagnoses   Final diagnoses:  Paronychia of finger of left hand     Discharge Instructions      Medications as prescribed.  Warm compresses.  If this worsens or does not improve, please return or see your PCP.   ED Prescriptions     Medication Sig Dispense Auth. Provider   mupirocin ointment (BACTROBAN) 2 % Apply 1 Application topically 3 (three) times daily for 7 days. 30 g Griselda Bramblett G, DO   doxycycline (VIBRA-TABS) 100 MG tablet Take 1 tablet (100 mg total) by mouth 2 (two) times daily. 14 tablet Tommie Sams, DO      PDMP not reviewed this encounter.   Tommie Sams, [place] 12/07/21 1412

## 2021-12-07 NOTE — Discharge Instructions (Signed)
Medications as prescribed.  Warm compresses.  If this worsens or does not improve, please return or see your PCP.

## 2022-04-28 ENCOUNTER — Ambulatory Visit (INDEPENDENT_AMBULATORY_CARE_PROVIDER_SITE_OTHER): Payer: 59

## 2022-04-28 ENCOUNTER — Ambulatory Visit
Admission: RE | Admit: 2022-04-28 | Discharge: 2022-04-28 | Disposition: A | Discharge status: Home / Self Care | Payer: 59 | Source: Ambulatory Visit | Attending: Emergency Medicine | Admitting: Emergency Medicine

## 2022-04-28 VITALS — BP 167/86 | HR 77 | Temp 99.1°F | Resp 16 | Ht 75.0 in | Wt 284.0 lb

## 2022-04-28 DIAGNOSIS — L03031 Cellulitis of right toe: Secondary | ICD-10-CM

## 2022-04-28 MED ORDER — DOXYCYCLINE HYCLATE 100 MG PO CAPS: 100.0000 mg | ORAL_CAPSULE | Freq: Two times a day (BID) | ORAL | 0 refills | Status: DC

## 2022-04-28 NOTE — Discharge Instructions (Addendum)
Your x-ray did not show any evidence of inflammation of the bone indicating a deeper infection.  Keep your foot clean and dry and keep it open to air is much as possible.  Take the doxycycline 100 mg twice daily for 10 days for treatment of soft tissue skin infection.  I have referred you to podiatry at [facility] for further evaluation should your symptoms not improve.

## 2022-04-28 NOTE — ED Provider Notes (Signed)
[facility]    CSN: [id number] Arrival date & time: 04/28/22  1554      History   Chief Complaint Chief Complaint  Patient presents with   Toe Infection    Appt    HPI MAX SWEENY is a 62 y.o. male.   HPI  55 old male with a past medical history significant for hypertension, osteoarthritis, sleep apnea, spinal stenosis, and neuromuscular disorder presenting for evaluation of redness and swelling of his right second toe that is been present for the past 2 weeks.  He states that it started as a blister.  And it has progressed to with his redness.  It started to drain today.  He denies any fever, numbness or tingling, or pain.  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   Pain in joint of right hip 05/03/2018   Osteoarthritis of knee 11/18/2017   History of revision of total replacement of left hip joint 08/17/2017   OA (osteoarthritis) of hip 08/02/2017   Osteoarthritis of left knee 07/02/2017   Arthritis of left hip 06/30/2017   S/P lumbar spinal fusion 06/30/2017   Spinal stenosis of lumbar region 03/03/2017   Lumbar radiculopathy 02/24/2017   S/P left knee arthroscopy 01/21/2017   Acute lateral meniscus tear of left knee 12/31/2016   Chronic pain of left knee 11/10/2016   Cervical pain (neck) 06/01/2014   Shoulder joint pain 06/01/2014    Past Surgical History:  Procedure Laterality Date   ANTERIOR CERVICAL  DECOMP/DISCECTOMY FUSION N/A 12/11/2020   Procedure: ANTERIOR CERVICAL DECOMPRESSION/DISCECTOMY FUSION 2 LEVELS (ACDF CERVICAL FIVE - CERVICAL SEVEN);  Surgeon: Melina Schools, MD;  Location: [facility];  Service: Orthopedics;  Laterality: N/A;   CERVICAL SPINE SURGERY  1997   bone spur removal due to L UE radicular symptoms   EYE SURGERY Right    retinel detachment   INCISION AND DRAINAGE COMPLEX POSTOPERATIVE WOUND INFECTION, BACK  05-13-2017  @ [facility]   KNEE ARTHROSCOPY Left x6 last one 12/ 2018   LUMBAR FUSION  2008   L3-4   POSTERIOR LUMBAR FUSION  04-12-2017   @ [facility]   L2-3, L5-S1 OLIF and posterior decompression; revision L2-S1 PSIF   SHOULDER ARTHROSCOPY W/ SUBACROMIAL DECOMPRESSION AND DISTAL CLAVICLE EXCISION Left 08/02/2014   TOTAL HIP ARTHROPLASTY Left 08/02/2017   Procedure: LEFT TOTAL HIP ARTHROPLASTY ANTERIOR APPROACH;  Surgeon: Gaynelle Arabian, MD;  Location: [facility];  Service: Orthopedics;  Laterality: Left;   TOTAL HIP ARTHROPLASTY Right 07/13/2018   Procedure: TOTAL HIP ARTHROPLASTY ANTERIOR APPROACH;  Surgeon: Gaynelle Arabian, MD;  Location: [facility];  Service: Orthopedics;  Laterality: Right;  127min   VITRECTOMY Right 03/18/2016       Home Medications    Prior to Admission medications   Medication Sig  Start Date End Date Taking? Authorizing Provider  amitriptyline (ELAVIL) 100 MG tablet Take 200 mg by mouth at bedtime.   Yes [provider]  doxycycline (VIBRAMYCIN) 100 MG capsule Take 1 capsule (100 mg total) by mouth 2 (two) times daily. 04/28/22  Yes Margarette Canada, NP  folic acid (FOLVITE) 1 MG tablet Take 1 mg by mouth daily. 11/29/20  Yes [provider]  losartan-hydrochlorothiazide (HYZAAR) 100-25 MG tablet Take 1 tablet by mouth daily.   Yes [provider]  ondansetron (ZOFRAN) 4 MG tablet Take 1 tablet (4 mg total) by mouth every 8 (eight) hours as needed for nausea or vomiting. 12/11/20  Yes Melina Schools, MD  venlafaxine XR (EFFEXOR-XR) 75  MG 24 hr capsule Take 75 mg by mouth daily with breakfast.   Yes [provider]    Family History History reviewed. No pertinent family history.  Social History Social History   Tobacco Use   Smoking status: Never   Smokeless tobacco: Never  Vaping Use   Vaping Use: Never used  Substance Use Topics   Alcohol use: Yes    Comment: rare   Drug use: Never     Allergies   Penicillins and Neurontin [gabapentin]   Review of Systems Review of Systems  Constitutional:  Negative for fever.  Musculoskeletal:  Positive for joint swelling. Negative for arthralgias.  Skin:  Positive for color change and wound.     Physical Exam Triage Vital Signs ED Triage Vitals  Enc Vitals Group     BP 04/28/22 1609 (!) 167/86     Pulse Rate 04/28/22 1609 77     Resp 04/28/22 1609 16     Temp 04/28/22 1609 99.1 F (37.3 C)     Temp Source 04/28/22 1609 Oral     SpO2 04/28/22 1609 97 %     Weight 04/28/22 1608 284 lb (128.8 kg)     Height 04/28/22 1608 6\' 3"  (1.905 m)     Head Circumference --      Peak Flow --      Pain Score 04/28/22 1608 0     Pain Loc --      Pain Edu? --      Excl. in [place]? --    No data found.  Updated Vital Signs BP (!) 167/86 (BP Location: Left Arm)   Pulse 77   Temp 99.1 F (37.3 C) (Oral)   Resp 16   Ht 6\' 3"  (1.905 m)   Wt 284 lb (128.8 kg)   SpO2 97%   BMI 35.50 kg/m   Visual Acuity Right Eye Distance:   Left Eye Distance:   Bilateral Distance:    Right Eye Near:   Left Eye Near:    Bilateral Near:     Physical Exam Vitals and nursing note reviewed.  Constitutional:      Appearance: Normal appearance. He is not ill-appearing.  HENT:     Head: Normocephalic and atraumatic.  Musculoskeletal:        General: Swelling and tenderness present. No deformity or signs of injury.  Skin:    General: Skin is warm and dry.     Capillary Refill: Capillary refill takes less than 2 seconds.     Findings: Erythema present.  Neurological:      Mental Status: He is alert.      UC Treatments / Results  Labs (all labs ordered are listed, but only abnormal results are displayed) Labs Reviewed - No data to display  EKG   Radiology DG Toe 2nd Right  Result Date: 04/28/2022 CLINICAL DATA:  Pain and swelling at the PIP joint for 2 weeks. EXAM: RIGHT SECOND TOE COMPARISON:  Right foot radiographs 11/08/2018 FINDINGS: No acute fracture, dislocation, or destructive osseous process is identified. There is a chronic hyperflexion deformity of the PIP joint of the second toe. No radiopaque foreign body or subcutaneous emphysema is evident. IMPRESSION: No acute osseous abnormality identified. Electronically Signed   By: Logan Bores M.D.   On: 04/28/2022 17:13    Procedures Procedures (including critical care time)  Medications Ordered in UC Medications - No data to display  Initial Impression / Assessment and Plan / UC Course  I have reviewed the triage vital signs and the nursing notes.  Pertinent labs & imaging results that were available during my care of the patient were reviewed by me and considered in my medical decision making (see chart for details).   Patient is a pleasant, nontoxic-appearing 62 year old male here for evaluation of redness, swelling, drainage from the right second toe without any associated pain.  Using the image above there is erythema to the second toe as well as edema over the IP joint.  There is a scabbed area in the center where there used to be a blister.  Patient reports that it is draining but he cannot see what it is draining.  It is not tender to palpation but it is erythematous and warm.  The erythema is blanchable.  He does have range of motion and sensation in the toe but the range of motion is decreased secondary to the swelling.  Given the amount of edema and the length of time that the infection has been present I will obtain an x-ray of the toe to rule out any possible osteomyelitis.  The plan  will be to discharge patient home if his x-ray is negative on doxycycline 100 mg twice daily for 10 days.  I will also give him a referral to podiatry to follow-up if his symptoms do not improve.  Radiology impression states there is no acute fracture, dislocation, or destructive osseous process identified.  I will discharge patient home with diagnosis of cellulitis of the second toe and start him on doxycycline 100 mg twice daily.  I will also refer him to [facility] for follow-up should his symptoms not improve.  Final Clinical Impressions(s) / UC Diagnoses   Final diagnoses:  Cellulitis of second toe of right foot     Discharge Instructions      Your x-ray did not show any evidence of inflammation of the bone indicating a deeper infection.  Keep your foot clean and dry and keep it open to air is much as possible.  Take the doxycycline 100 mg twice daily for 10 days for treatment of soft tissue skin infection.  I have referred you to podiatry at [facility] for further evaluation should your symptoms not improve.     ED Prescriptions     Medication Sig Dispense Auth. Provider   doxycycline (VIBRAMYCIN) 100 MG capsule Take 1 capsule (100 mg total) by mouth 2 (two) times daily. 20 capsule Margarette Canada, NP      PDMP not reviewed this encounter.   Margarette Canada, NP 04/28/22 1726

## 2022-04-28 NOTE — ED Triage Notes (Signed)
Pt c/o R second digit toe infection x14 days, states it started to drain & is red. Denies any pain, states it started out as a blister.

## 2022-09-01 ENCOUNTER — Telehealth: Payer: Self-pay

## 2022-09-01 ENCOUNTER — Ambulatory Visit
Admission: RE | Admit: 2022-09-01 | Discharge: 2022-09-01 | Disposition: A | Discharge status: Home / Self Care | Payer: 59 | Source: Ambulatory Visit

## 2022-09-01 VITALS — BP 136/90 | HR 72 | Temp 97.7°F | Resp 16 | Wt 285.0 lb

## 2022-09-01 DIAGNOSIS — M5010 Cervical disc disorder with radiculopathy, unspecified cervical region: Secondary | ICD-10-CM | POA: Diagnosis not present

## 2022-09-01 MED ORDER — PREDNISONE 10 MG (21) PO TBPK: ORAL_TABLET | ORAL | 0 refills | Status: DC

## 2022-09-01 MED ORDER — DEXAMETHASONE SODIUM PHOSPHATE 10 MG/ML IJ SOLN
10.0000 mg | Freq: Once | INTRAMUSCULAR | Status: AC
  Administered 2022-09-01: 10 mg via INTRAMUSCULAR

## 2022-09-01 MED ORDER — BACLOFEN 10 MG PO TABS: 10.0000 mg | ORAL_TABLET | Freq: Three times a day (TID) | ORAL | 0 refills | Status: DC

## 2022-09-01 MED ORDER — BACLOFEN 10 MG PO TABS: 10.0000 mg | ORAL_TABLET | Freq: Three times a day (TID) | ORAL | 0 refills | Status: AC

## 2022-09-01 NOTE — ED Provider Notes (Signed)
[facility]    CSN: [id number] Arrival date & time: 09/01/22  1410      History   Chief Complaint Chief Complaint  Patient presents with   Neck Injury    Tingling on my left side of my neck. - Entered by patient   Neck Pain    HPI AIDIN DOBOS is a 62 y.o. male.   HPI  62 year old male with a past medical history significant for cervical radiculopathy, spinal stenosis of lumbar spine, lumbar radiculopathy, and two-level cervical fusion presents for evaluation of tingling to the left side of his neck and shoulder.  He reports that the symptoms started 2 weeks ago.  He reports that he has been having trouble sleeping in his bed so he has been sleeping on the couch and also upright in his recliner which may be contributing to the discomfort.  He reports that the symptoms started with what he felt like a crick in the left side of his neck but the crick is gone and the numbness and tingling still remain.  He denies any weakness in his grip.  He has not seen his neurosurgeon since he had his spinal surgery a year and a half ago.  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   Pain in joint of right hip 05/03/2018   Osteoarthritis of knee 11/18/2017   History of revision of total replacement of left hip joint 08/17/2017   OA (osteoarthritis) of hip 08/02/2017   Osteoarthritis of left knee 07/02/2017   Arthritis of left hip  06/30/2017   S/P lumbar spinal fusion 06/30/2017   Spinal stenosis of lumbar region 03/03/2017   Lumbar radiculopathy 02/24/2017   S/P left knee arthroscopy 01/21/2017   Acute lateral meniscus tear of left knee 12/31/2016   Chronic pain of left knee 11/10/2016   Cervical pain (neck) 06/01/2014   Shoulder joint pain 06/01/2014    Past Surgical History:  Procedure Laterality Date   ANTERIOR CERVICAL DECOMP/DISCECTOMY FUSION N/A 12/11/2020   Procedure: ANTERIOR CERVICAL DECOMPRESSION/DISCECTOMY FUSION 2 LEVELS (ACDF CERVICAL FIVE - CERVICAL SEVEN);  Surgeon: Venita Lick, MD;  Location: [facility];  Service: Orthopedics;  Laterality: N/A;   CERVICAL SPINE SURGERY  1997   bone spur removal due to L UE radicular symptoms   EYE SURGERY Right    retinel detachment   INCISION AND DRAINAGE COMPLEX POSTOPERATIVE WOUND INFECTION, BACK  05-13-2017  @ [facility]   KNEE ARTHROSCOPY Left x6 last one 12/ 2018   LUMBAR FUSION  2008   L3-4   POSTERIOR LUMBAR FUSION  04-12-2017   @ [facility]   L2-3, L5-S1 OLIF and posterior decompression; revision L2-S1 PSIF   SHOULDER ARTHROSCOPY W/ SUBACROMIAL DECOMPRESSION AND DISTAL CLAVICLE EXCISION Left 08/02/2014   TOTAL HIP ARTHROPLASTY  Left 08/02/2017   Procedure: LEFT TOTAL HIP ARTHROPLASTY ANTERIOR APPROACH;  Surgeon: Ollen Gross, MD;  Location: [facility];  Service: Orthopedics;  Laterality: Left;   TOTAL HIP ARTHROPLASTY Right 07/13/2018   Procedure: TOTAL HIP ARTHROPLASTY ANTERIOR APPROACH;  Surgeon: Ollen Gross, MD;  Location: [facility];  Service: Orthopedics;  Laterality: Right;    VITRECTOMY Right 03/18/2016       Home Medications    Prior to Admission medications   Medication Sig Start Date End Date Taking? Authorizing Provider  amitriptyline (ELAVIL) 100 MG tablet Take 200 mg by mouth at bedtime.   Yes [provider]  baclofen (LIORESAL) 10 MG tablet Take 1 tablet (10 mg total) by mouth 3 (three) times daily. 09/01/22  Yes Becky Augusta, NP   Diclofenac Sodium CR 100 MG 24 hr tablet Take 1 tablet by mouth daily. 01/10/21  Yes [provider]  folic acid (FOLVITE) 1 MG tablet Take 1 mg by mouth daily. 11/29/20  Yes [provider]  hydrOXYzine (ATARAX) 10 MG tablet hydroxyzine HCl 10 mg tablet  TAKE 1 TO 3 TABLETS BY MOUTH EVERY 4 TO 6 HOURS AS NEEDED 01/20/21  Yes [provider]  losartan-hydrochlorothiazide (HYZAAR) 100-25 MG tablet Take 1 tablet by mouth daily.   Yes [provider]  predniSONE (STERAPRED UNI-PAK 21 TAB) 10 MG (21) TBPK tablet Take 6 tablets on day 1, 5 tablets day 2, 4 tablets day 3, 3 tablets day 4, 2 tablets day 5, 1 tablet day 6 09/01/22  Yes Becky Augusta, NP  tadalafil (CIALIS) 5 MG tablet Take 5 mg by mouth daily. 03/10/22  Yes [provider]  venlafaxine XR (EFFEXOR-XR) 75 MG 24 hr capsule Take 75 mg by mouth daily with breakfast.   Yes [provider]  Vitamin D, Ergocalciferol, (DRISDOL) 1.25 MG (50000 UNIT) CAPS capsule Take 50,000 Units by mouth 2 (two) times a week. 06/25/22  Yes [provider]    Family History History reviewed. No pertinent family history.  Social History Social History   Tobacco Use   Smoking status: Never   Smokeless tobacco: Never  Vaping Use   Vaping status: Never Used  Substance Use Topics   Alcohol use: Yes    Comment: rare   Drug use: Never     Allergies   Penicillins and Neurontin [gabapentin]   Review of Systems Review of Systems  Constitutional:  Negative for fever.  Musculoskeletal:  Positive for neck pain.  Neurological:  Positive for numbness. Negative for weakness.     Physical Exam Triage Vital Signs ED Triage Vitals  Encounter Vitals Group     BP      Systolic BP Percentile      Diastolic BP Percentile      Pulse      Resp      Temp      Temp src      SpO2      Weight      Height      Head Circumference      Peak Flow      Pain Score      Pain Loc      Pain Education       Exclude from Growth Chart    No data found.  Updated Vital Signs BP (!) 136/90 (BP Location: Right Arm)   Pulse 72   Temp 97.7 F (36.5 C) (Oral)   Resp 16   Wt 285 lb (129.3 kg)  SpO2 97%   BMI 35.62 kg/m   Visual Acuity Right Eye Distance:   Left Eye Distance:   Bilateral Distance:    Right Eye Near:   Left Eye Near:    Bilateral Near:     Physical Exam Vitals and nursing note reviewed.  Constitutional:      Appearance: Normal appearance. He is not ill-appearing.  HENT:     Head: Normocephalic and atraumatic.  Musculoskeletal:        General: No swelling, tenderness, deformity or signs of injury. Normal range of motion.  Skin:    General: Skin is warm and dry.     Capillary Refill: Capillary refill takes less than 2 seconds.  Neurological:     General: No focal deficit present.     Mental Status: He is alert and oriented to person, place, and time.     Motor: No weakness.      UC Treatments / Results  Labs (all labs ordered are listed, but only abnormal results are displayed) Labs Reviewed - No data to display  EKG   Radiology No results found.  Procedures Procedures (including critical care time)  Medications Ordered in UC Medications  dexamethasone (DECADRON) injection 10 mg (has no administration in time range)    Initial Impression / Assessment and Plan / UC Course  I have reviewed the triage vital signs and the nursing notes.  Pertinent labs & imaging results that were available during my care of the patient were reviewed by me and considered in my medical decision making (see chart for details).   Patient is a pleasant, nontoxic-appearing 63 year old male presenting for evaluation of tingling to the left side of his neck and left shoulder x 14 days as outlined in HPI above.  On exam he has no midline spinous process tenderness and he has full range of motion of his neck without pain.  He does have significant tension in the upper  trapezius muscles bilaterally.  Bilateral grips are 5/5 in bilateral upper extremity strength is also 5/5.  Given his history of two-level cervical disc fusion and cervical radiculopathy I suspect that he has new nerve impingement and I have advised him to make contact with his neurosurgeon for evaluation.  We will attempt to mitigate the symptoms with short course of prednisone.  We will give an injection of 10 mg of Decadron here in clinic and discharge him home on a prednisone taper.  We can also try some nonsedating muscle laxer such as baclofen as the muscle tension may be a component.   Final Clinical Impressions(s) / UC Diagnoses   Final diagnoses:  Cervical disc disorder with radiculopathy of cervical region     Discharge Instructions      Call and schedule appointment with your neurosurgeon at [facility] in [place] regarding your numbness and tingling in the left side of your neck for further evaluation and imaging.  Start tomorrow morning breakfast time take the prednisone according to the package instructions.  You will take it each day on a decreasing dose over period of 6 days.  This will help decrease inflammation of the nerve and hopefully help the tingling sensation you are experiencing.  You do have significant tension in the upper trapezius muscles on both sides of your neck which may be contributing to your symptoms.  I want you to use the baclofen 10 mg every 8 hours to help with muscle tension.   If you develop any new or worsening symptoms to return  for reevaluation or seek care in the emergency department.     ED Prescriptions     Medication Sig Dispense Auth. Provider   predniSONE (STERAPRED UNI-PAK 21 TAB) 10 MG (21) TBPK tablet Take 6 tablets on day 1, 5 tablets day 2, 4 tablets day 3, 3 tablets day 4, 2 tablets day 5, 1 tablet day 6 21 tablet Becky Augusta, NP   baclofen (LIORESAL) 10 MG tablet Take 1 tablet (10 mg total) by mouth 3 (three) times daily. 30  each Becky Augusta, NP      PDMP not reviewed this encounter.   Becky Augusta, NP 09/01/22 1433

## 2022-09-01 NOTE — Telephone Encounter (Signed)
Pharmacy changed new rx sent in.

## 2022-09-01 NOTE — Discharge Instructions (Addendum)
Call and schedule appointment with your neurosurgeon at [facility] in [place] regarding your numbness and tingling in the left side of your neck for further evaluation and imaging.  Start tomorrow morning breakfast time take the prednisone according to the package instructions.  You will take it each day on a decreasing dose over period of 6 days.  This will help decrease inflammation of the nerve and hopefully help the tingling sensation you are experiencing.  You do have significant tension in the upper trapezius muscles on both sides of your neck which may be contributing to your symptoms.  I want you to use the baclofen 10 mg every 8 hours to help with muscle tension.   If you develop any new or worsening symptoms to return for reevaluation or seek care in the emergency department.

## 2022-09-01 NOTE — ED Triage Notes (Signed)
shoulder/neck area - Tingling on my left side of my neck. 14 days no injury.

## 2023-05-05 ENCOUNTER — Ambulatory Visit: Payer: Self-pay

## 2023-06-28 ENCOUNTER — Ambulatory Visit: Payer: Self-pay

## 2023-06-29 ENCOUNTER — Ambulatory Visit: Payer: Self-pay

## 2023-06-30 LAB — COLOGUARD: COLOGUARD: NEGATIVE

## 2023-09-13 ENCOUNTER — Ambulatory Visit (INDEPENDENT_AMBULATORY_CARE_PROVIDER_SITE_OTHER)

## 2023-09-13 ENCOUNTER — Ambulatory Visit
Admission: RE | Admit: 2023-09-13 | Discharge: 2023-09-13 | Disposition: A | Discharge status: Home / Self Care | Source: Ambulatory Visit | Attending: Family Medicine | Admitting: Family Medicine

## 2023-09-13 VITALS — BP 100/64 | HR 77 | Temp 98.1°F | Resp 16 | Wt 290.0 lb

## 2023-09-13 DIAGNOSIS — M1712 Unilateral primary osteoarthritis, left knee: Secondary | ICD-10-CM | POA: Diagnosis not present

## 2023-09-13 DIAGNOSIS — M25562 Pain in left knee: Secondary | ICD-10-CM

## 2023-09-13 MED ORDER — PREDNISONE 10 MG (21) PO TBPK: ORAL_TABLET | ORAL | 0 refills | Status: AC

## 2023-09-13 NOTE — ED Triage Notes (Signed)
 Pt tripped and fell 5 days ago and landed on his left knee. He fell on concrete and also hit his head. He denies loss of consciousness. He has some abrasion and pain. Pt took Advil for the pain.

## 2023-09-13 NOTE — Discharge Instructions (Signed)
 On my review of your xray images, you did not have any fractures or dislocated bones. The radiologist noted some significant arthritis in your knee and you do have some joint swelling.  You should see your results in MyChart.   Stop by the pharmacy to pick up your prescriptions. Follow up with your orthopedic doctor.

## 2023-09-13 NOTE — ED Provider Notes (Signed)
 [facility]    CSN: [id number] Arrival date & time: 09/13/23  9074      History   Chief Complaint Chief Complaint  Patient presents with   Fall    HPI  HPI Hunter Swanson is a 63 y.o. male.   Hunter Swanson presents for left knee pain after falling on Wednesday.  He landed on his left knee. Did not pass out.  He hit his head on concrete but has no headache or head wounds.   Has some abrasionos on his knee.  Continues to have pain with movement.  Has tried Percocet without relief.  Notes he has had several arthroscopies of this knee and gel injections.  Follows with an orthopedic provider.     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   Pain in joint of right hip 05/03/2018   Osteoarthritis of knee 11/18/2017   History of revision of total replacement of left hip joint 08/17/2017   OA (osteoarthritis) of hip 08/02/2017   Osteoarthritis of left knee 07/02/2017   Arthritis of left hip 06/30/2017   S/P lumbar spinal fusion 06/30/2017   Spinal stenosis of lumbar region 03/03/2017   Lumbar radiculopathy 02/24/2017   S/P left knee arthroscopy 01/21/2017   Acute lateral meniscus tear of left knee 12/31/2016   Chronic pain of left knee 11/10/2016   Cervical pain (neck) 06/01/2014   Shoulder joint pain 06/01/2014    Past Surgical History:  Procedure Laterality Date   ANTERIOR CERVICAL DECOMP/DISCECTOMY FUSION N/A 12/11/2020    Procedure: ANTERIOR CERVICAL DECOMPRESSION/DISCECTOMY FUSION 2 LEVELS (ACDF CERVICAL FIVE - CERVICAL SEVEN);  Surgeon: Burnetta Aures, MD;  Location: [facility] OR;  Service: Orthopedics;  Laterality: N/A;   CERVICAL SPINE SURGERY  1997   bone spur removal due to L UE radicular symptoms   EYE SURGERY Right    retinel detachment   INCISION AND DRAINAGE COMPLEX POSTOPERATIVE WOUND INFECTION, BACK  05-13-2017  @ [facility]   KNEE ARTHROSCOPY Left x6 last one 12/ 2018   LUMBAR FUSION  2008   L3-4   POSTERIOR LUMBAR FUSION  04-12-2017   @ [facility]   L2-3, L5-S1 OLIF and posterior decompression; revision L2-S1 PSIF   SHOULDER ARTHROSCOPY W/ SUBACROMIAL DECOMPRESSION AND DISTAL CLAVICLE EXCISION Left 08/02/2014   TOTAL HIP ARTHROPLASTY Left 08/02/2017   Procedure: LEFT TOTAL HIP ARTHROPLASTY ANTERIOR APPROACH;  Surgeon: Melodi Lerner, MD;  Location: [facility];  Service: Orthopedics;  Laterality: Left;   TOTAL HIP ARTHROPLASTY Right 07/13/2018   Procedure: TOTAL HIP ARTHROPLASTY ANTERIOR APPROACH;  Surgeon: Melodi Lerner, MD;  Location: [facility];  Service: Orthopedics;  Laterality: Right;    VITRECTOMY Right 03/18/2016       Home Medications    Prior to Admission medications   Medication Sig  Start Date End Date Taking? Authorizing Provider  amitriptyline  (ELAVIL ) 100 MG tablet Take 200 mg by mouth at bedtime.    [provider]  baclofen  (LIORESAL ) 10 MG tablet Take 1 tablet (10 mg total) by mouth 3 (three) times daily. 09/01/22   Bernardino Ditch, NP  Diclofenac Sodium CR 100 MG 24 hr tablet Take 1 tablet by mouth daily. 01/10/21   [provider]  folic acid (FOLVITE) 1 MG tablet Take 1 mg by mouth daily. 11/29/20   [provider]  hydrOXYzine (ATARAX) 10 MG tablet hydroxyzine HCl 10 mg tablet  TAKE 1 TO 3 TABLETS BY MOUTH EVERY 4 TO 6 HOURS AS NEEDED 01/20/21   [provider]  losartan -hydrochlorothiazide  (HYZAAR) 100-25 MG tablet Take 1 tablet by mouth daily.     [provider]  predniSONE  (STERAPRED UNI-PAK 21 TAB) 10 MG (21) TBPK tablet Take 6 tabs by mouth daily for 1, then 5 tabs for 1 day, then 4 tabs for 1 day, then 3 tabs for 1 day, then 2 tabs for 1 day, then 1 tab for 1 day. 09/13/23   Torri Michalski, DO  tadalafil (CIALIS) 5 MG tablet Take 5 mg by mouth daily. 03/10/22   [provider]  venlafaxine  XR (EFFEXOR -XR) 75 MG 24 hr capsule Take 75 mg by mouth daily with breakfast.    [provider]  Vitamin D, Ergocalciferol, (DRISDOL) 1.25 MG (50000 UNIT) CAPS capsule Take 50,000 Units by mouth 2 (two) times a week. 06/25/22   [provider]    Family History History reviewed. No pertinent family history.  Social History Social History   Tobacco Use   Smoking status: Never   Smokeless tobacco: Never  Vaping Use   Vaping status: Never Used  Substance Use Topics   Alcohol use: Yes    Comment: rare   Drug use: Never     Allergies   Penicillins and Neurontin [gabapentin]   Review of Systems Review of Systems: :negative unless otherwise stated in HPI.      Physical Exam Triage Vital Signs ED Triage Vitals  Encounter Vitals Group     BP 09/13/23 0952 100/64     Girls Systolic BP Percentile --      Girls Diastolic BP Percentile --      Boys Systolic BP Percentile --      Boys Diastolic BP Percentile --      Pulse Rate 09/13/23 0952 77     Resp 09/13/23 0952 16     Temp 09/13/23 0952 98.1 F (36.7 C)     Temp Source 09/13/23 0952 Oral     SpO2 09/13/23 0952 96 %     Weight 09/13/23 0951 290 lb (131.5 kg)     Height --      Head Circumference --      Peak Flow --      Pain Score 09/13/23 0951 2     Pain Loc --      Pain Education --      Exclude from Growth Chart --    No data found.  Updated Vital Signs BP 100/64 (BP Location: Left Arm)   Pulse 77   Temp 98.1 F (36.7 C) (Oral)   Resp 16   Wt 131.5 kg   SpO2 96%   BMI 36.25 kg/m   Visual Acuity Right Eye Distance:    Left Eye Distance:   Bilateral Distance:    Right Eye Near:   Left Eye Near:  Bilateral Near:     Physical Exam GEN: well appearing male in no acute distress  CVS: well perfused  RESP: speaking in full sentences without pause, no respiratory distress  MSK:   Left Knee Exam -Inspection: no deformity, no discoloration -Palpation: + lateral joint line tenderness, no tibial tuberosity tenderness, small joint infusion palpable, +patellar tendon tenderness, + patella TTP with overlying abrasions  -ROM: good active and full passive ROM  Strength testing was limited due to pain -Special Tests: Varus Stress: Negative; Valgus Stress: Negative; Anterior: Negative; Posterior drawer: Negative;  Thessaly: Not attempted; Patellar grind: positive  -Limb neurovascularly intact, no instability noted    UC Treatments / Results  Labs (all labs ordered are listed, but only abnormal results are displayed) Labs Reviewed - No data to display  EKG   Radiology DG Knee Complete 4 Views Left Result Date: 09/13/2023 CLINICAL DATA:  63 year old male status post fall 5 days ago landing on knee. Anterior knee pain. EXAM: LEFT KNEE - COMPLETE 4+ VIEW COMPARISON:  None Available. FINDINGS: Four views including patellar sunrise. Moderate to severe tricompartmental joint space loss and degenerative spurring. Lateral compartment appears most severely affected. Suprapatellar joint effusion is small to moderate. 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 No acute osseous abnormality identified. Calcified peripheral vascular disease. Anterior soft tissue swelling. IMPRESSION: 1. Anterior soft tissue swelling. 2. Small to moderate suprapatellar joint effusion superimposed on severe tricompartmental joint degeneration, worst in the lateral compartment. 3.  No acute osseous abnormality identified. Electronically Signed   By: VEAR Hurst M.D.   On: 09/13/2023 11:12     Procedures Procedures  (including critical care time)  Medications Ordered in UC Medications - No data to display  Initial Impression / Assessment and Plan / UC Course  I have reviewed the triage vital signs and the nursing notes.  Pertinent labs & imaging results that were available during my care of the patient were reviewed by me and considered in my medical decision making (see chart for details).      Pt is a 63 y.o.  male with acute on chronic left kneepain after a fall.   On exam, pt has tenderness at lateral joint line, patellar tendon and patella concerning for fracture.   Obtained  left kneeplain films.  Personally interpreted by me were unremarkable for fracture or dislocation. Radiologist report reviewed and additionally notes joint effusion.   Patient to gradually return to normal activities, as tolerated and continue ordinary activities within the limits permitted by pain. Prescribed prednisone  taper for pain relief.  Tylenol  PRN.   Patient to follow up with orthopedic provider, if symptoms do not improve with conservative treatment.  Return and ED precautions given. Understanding voiced. Discussed MDM, treatment plan and plan for follow-up with patient who agrees with plan.   Final Clinical Impressions(s) / UC Diagnoses   Final diagnoses:  Left anterior knee pain  Primary osteoarthritis of left knee     Discharge Instructions      On my review of your xray images, you did not have any fractures or dislocated bones. The radiologist noted some significant arthritis in your knee and you do have some joint swelling.  You should see your results in MyChart.   Stop by the pharmacy to pick up your prescriptions. Follow up with your orthopedic doctor.        ED Prescriptions     Medication Sig Dispense Auth. Provider   predniSONE  (STERAPRED UNI-PAK 21 TAB) 10 MG (21)  TBPK tablet Take 6 tabs by mouth daily for 1, then 5 tabs for 1 day, then 4 tabs for 1 day, then 3 tabs for 1 day, then 2  tabs for 1 day, then 1 tab for 1 day. 21 tablet Kimba Lottes, DO      PDMP not reviewed this encounter.   Belvie Iribe, DO 09/13/23 1127

## 2023-11-04 ENCOUNTER — Ambulatory Visit: Payer: Self-pay

## 2023-11-04 ENCOUNTER — Ambulatory Visit (INDEPENDENT_AMBULATORY_CARE_PROVIDER_SITE_OTHER)

## 2023-11-04 ENCOUNTER — Ambulatory Visit
Admission: RE | Admit: 2023-11-04 | Discharge: 2023-11-04 | Disposition: A | Discharge status: Home / Self Care | Source: Ambulatory Visit | Attending: Family Medicine | Admitting: Family Medicine

## 2023-11-04 VITALS — BP 159/69 | HR 82 | Temp 98.4°F | Resp 20 | Wt 290.5 lb

## 2023-11-04 DIAGNOSIS — M79671 Pain in right foot: Secondary | ICD-10-CM | POA: Diagnosis not present

## 2023-11-04 DIAGNOSIS — Z23 Encounter for immunization: Secondary | ICD-10-CM | POA: Diagnosis not present

## 2023-11-04 DIAGNOSIS — S90851A Superficial foreign body, right foot, initial encounter: Secondary | ICD-10-CM | POA: Diagnosis not present

## 2023-11-04 MED ORDER — TETANUS-DIPHTH-ACELL PERTUSSIS 5-2-15.5 LF-MCG/0.5 IM SUSP
0.5000 mL | Freq: Once | INTRAMUSCULAR | Status: AC
  Administered 2023-11-04: 0.5 mL via INTRAMUSCULAR

## 2023-11-04 MED ORDER — SULFAMETHOXAZOLE-TRIMETHOPRIM 800-160 MG PO TABS: 1.0000 | ORAL_TABLET | Freq: Two times a day (BID) | ORAL | 0 refills | Status: AC

## 2023-11-04 NOTE — ED Provider Notes (Signed)
 [facility]    CSN: [id number] Arrival date & time: 11/04/23  1747      History   Chief Complaint Chief Complaint  Patient presents with   Foot Injury    HPI  HPI Hunter Swanson is a 63 y.o. male.   Hunter Swanson presents for right foot pain after the bottom of his foot that started after walking barefoot in his home a few days ago.  They broke a glass in the area about a week ago and he thinks he stepped in some glass.  His family has been cleaning the area and noticed there was a small cut there. Hurts to walk.      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   Pain in joint of right hip 05/03/2018   Osteoarthritis of knee 11/18/2017   History of revision of total replacement of left hip joint 08/17/2017   OA (osteoarthritis) of hip 08/02/2017   Osteoarthritis of left knee 07/02/2017   Arthritis of left hip 06/30/2017   S/P lumbar spinal fusion 06/30/2017   Spinal stenosis of lumbar region 03/03/2017   Lumbar radiculopathy 02/24/2017   S/P left knee arthroscopy 01/21/2017   Acute lateral meniscus tear of left knee 12/31/2016   Chronic pain of left knee 11/10/2016   Cervical pain (neck) 06/01/2014   Shoulder joint pain 06/01/2014    Past Surgical History:  Procedure Laterality Date   ANTERIOR CERVICAL DECOMP/DISCECTOMY FUSION N/A 12/11/2020   Procedure: ANTERIOR CERVICAL DECOMPRESSION/DISCECTOMY FUSION 2 LEVELS  (ACDF CERVICAL FIVE - CERVICAL SEVEN);  Surgeon: Burnetta Aures, MD;  Location: [facility] OR;  Service: Orthopedics;  Laterality: N/A;   CERVICAL SPINE SURGERY  1997   bone spur removal due to L UE radicular symptoms   EYE SURGERY Right    retinel detachment   INCISION AND DRAINAGE COMPLEX POSTOPERATIVE WOUND INFECTION, BACK  05-13-2017  @ [facility]   KNEE ARTHROSCOPY Left x6 last one 12/ 2018   LUMBAR FUSION  2008   L3-4   POSTERIOR LUMBAR FUSION  04-12-2017   @ [facility]   L2-3, L5-S1 OLIF and posterior decompression; revision L2-S1 PSIF   SHOULDER ARTHROSCOPY W/ SUBACROMIAL DECOMPRESSION AND DISTAL CLAVICLE EXCISION Left 08/02/2014   TOTAL HIP ARTHROPLASTY Left 08/02/2017   Procedure: LEFT TOTAL HIP ARTHROPLASTY ANTERIOR APPROACH;  Surgeon: Melodi Lerner, MD;  Location: [facility];  Service: Orthopedics;  Laterality: Left;   TOTAL HIP ARTHROPLASTY Right 07/13/2018   Procedure: TOTAL HIP ARTHROPLASTY ANTERIOR APPROACH;  Surgeon: Melodi Lerner, MD;  Location: [facility];  Service: Orthopedics;  Laterality: Right;    VITRECTOMY Right 03/18/2016       Home Medications    Prior to Admission medications   Medication Sig Start Date End Date Taking? Authorizing Provider  sulfamethoxazole-trimethoprim (BACTRIM  DS) 800-160 MG tablet Take 1 tablet by mouth 2 (two) times daily for 7 days. 11/04/23 11/11/23 Yes Hunter Mcginnity, DO  amitriptyline  (ELAVIL ) 100 MG tablet Take 200 mg by mouth at bedtime.    [provider]  baclofen  (LIORESAL ) 10 MG tablet Take 1 tablet (10 mg total) by mouth 3 (three) times daily. 09/01/22   Bernardino Ditch, NP  Diclofenac Sodium CR 100 MG 24 hr tablet Take 1 tablet by mouth daily. 01/10/21   [provider]  folic acid (FOLVITE) 1 MG tablet Take 1 mg by mouth daily. 11/29/20   [provider]  hydrOXYzine (ATARAX) 10 MG tablet hydroxyzine HCl 10 mg tablet  TAKE 1 TO 3 TABLETS BY MOUTH EVERY 4 TO 6 HOURS AS NEEDED 01/20/21   [provider]   losartan -hydrochlorothiazide  (HYZAAR) 100-25 MG tablet Take 1 tablet by mouth daily.    [provider]  predniSONE  (STERAPRED UNI-PAK 21 TAB) 10 MG (21) TBPK tablet Take 6 tabs by mouth daily for 1, then 5 tabs for 1 day, then 4 tabs for 1 day, then 3 tabs for 1 day, then 2 tabs for 1 day, then 1 tab for 1 day. 09/13/23   Ashawna Hanback, DO  tadalafil (CIALIS) 5 MG tablet Take 5 mg by mouth daily. 03/10/22   [provider]  venlafaxine  XR (EFFEXOR -XR) 75 MG 24 hr capsule Take 75 mg by mouth daily with breakfast.    [provider]  Vitamin D, Ergocalciferol, (DRISDOL) 1.25 MG (50000 UNIT) CAPS capsule Take 50,000 Units by mouth 2 (two) times a week. 06/25/22   [provider]    Family History History reviewed. No pertinent family history.  Social History Social History   Tobacco Use   Smoking status: Never   Smokeless tobacco: Never  Vaping Use   Vaping status: Never Used  Substance Use Topics   Alcohol use: Yes    Comment: rare   Drug use: Never     Allergies   Penicillins and Neurontin [gabapentin]   Review of Systems Review of Systems: :negative unless otherwise stated in HPI.      Physical Exam Triage Vital Signs ED Triage Vitals [11/04/23 1759]  Encounter Vitals Group     BP      Girls Systolic BP Percentile      Girls Diastolic BP Percentile      Boys Systolic BP Percentile      Boys Diastolic BP Percentile      Pulse      Resp      Temp      Temp src      SpO2      Weight 290 lb 8 oz (131.8 kg)     Height      Head Circumference      Peak Flow      Pain Score 4     Pain Loc      Pain Education      Exclude from Growth Chart    No data found.  Updated Vital Signs BP (!) 159/69 (BP Location: Right Arm)   Pulse 82   Temp 98.4 F (36.9 C) (Oral)   Resp 20   Wt 131.8 kg   SpO2 96%   BMI 36.31 kg/m   Visual Acuity Right Eye Distance:   Left Eye Distance:   Bilateral Distance:    Right Eye Near:   Left  Eye Near:    Bilateral Near:     Physical Exam  GEN: well appearing male in no acute distress  CVS: well perfused  RESP: speaking in full sentences without pause, no respiratory distress  MSK:  Ankle/Foot, right: TTP noted at the ball of his foot where there is a small 0.4 cm entry wound. No visible erythema, swelling, ecchymosis, or bony deformity. Able to walk 4 steps.       UC Treatments / Results  Labs (all labs ordered are listed, but only abnormal results are displayed) Labs Reviewed - No data to display  EKG   Radiology DG Foot Complete Right Result Date: 11/04/2023 CLINICAL DATA:  Plantar pain.  Query foreign body. EXAM: RIGHT FOOT COMPLETE - 3+ VIEW COMPARISON:  Right toes 04/28/2022.  Right forefoot 11/08/2018 FINDINGS: Mild hallux valgus deformity with mild degenerative changes in the first metatarsal-phalangeal joint. Degenerative changes in the interphalangeal joints. No evidence of acute fracture or dislocation. No focal bone lesion or bone destruction. Degenerative changes in the intertarsal joints. Vascular calcifications. In the plantar soft tissues between the level of the second and third metatarsal heads, a tiny radiopaque structure is demonstrated which was not present previously. There is mild soft tissue swelling here. This is likely a foreign body. The structure measures about 2.5 mm. IMPRESSION: 1. 2.5 mm probable foreign body demonstrated in the plantar soft tissues between the level of the second and third metatarsal heads. 2. Degenerative changes. 3. No acute bony abnormalities. Electronically Signed   By: Hunter Swanson M.D.   On: 11/04/2023 18:54     Procedures Procedures (including critical care time)  Medications Ordered in UC Medications  Tdap (ADACEL) injection 0.5 mL (0.5 mLs Intramuscular Given 11/04/23 1854)    Initial Impression / Assessment and Plan / UC Course  I have reviewed the triage vital signs and the nursing notes.  Pertinent labs  & imaging results that were available during my care of the patient were reviewed by me and considered in my medical decision making (see chart for details).      Pt is a 63 y.o.  male with  concern of foreign body in his foot after walking barefoot in an area where there was glass shards on the floor.   On exam, pt has tenderness at ball of his foot between the 2nd and 3rd metatarsals concerning for retained foreign body.   Obtained right foot plain films.  Personally interpreted by me showed retained foreign body in the area of concern but unremarkable for fracture or dislocation. Radiologist report reviewed and notes 2.5 mm foreign body.   Patient to gradually return to normal activities, as tolerated and continue ordinary activities within the limits permitted by pain. Prescribed antibiotics as below. Tetanus updated. Motrin and/or  Tylenol  PRN.   Patient to follow up with podiatrist for further evaluation and treatment.  Return and ED precautions given. Understanding voiced. Discussed MDM, treatment plan and plan for follow-up with patient who agrees with plan.   Final Clinical Impressions(s) / UC Diagnoses   Final diagnoses:  Right foot pain  Foreign body in right foot, initial encounter     Discharge Instructions      You have a foreign body in your foot that was seen on xray.  Follow up with foot and ankle specialist for further management of your foot or ankle concern.  You were given a tetanus vaccine today.  Let your primary care doctor know that you received a tetanus vaccine today.   Follow up with Dr Eva Gay  [address] [facility]  Dr  [facility] - Podiatry  [phone]   OR  Follow up with [facility] in [place].   Call [phone] [address] [place], [place] [address]        ED Prescriptions     Medication Sig Dispense Auth. Provider   sulfamethoxazole-trimethoprim (BACTRIM DS) 800-160 MG tablet Take 1 tablet by mouth 2 (two)  times daily for 7 days. 14 tablet Hunter Callaway, DO      PDMP not reviewed this encounter.   Salisa Broz, DO 11/04/23 8082

## 2023-11-04 NOTE — Discharge Instructions (Addendum)
 You have a foreign body in your foot that was seen on xray.  Follow up with foot and ankle specialist for further management of your foot or ankle concern.  You were given a tetanus vaccine today.  Let your primary care doctor know that you received a tetanus vaccine today.   Follow up with Dr Eva Gay  [address] [facility] [address]  [facility] - Podiatry  [phone]   OR  Follow up with [facility] in [place].   Call [phone] [address] [place], [place] [address]

## 2023-11-04 NOTE — ED Triage Notes (Signed)
 Patient stepped on something in the house over a week ago possibly tiny glass shards and has a hard bump and pain in ball of right foot.  Not taking OTC pain meds

## 2024-07-13 ENCOUNTER — Ambulatory Visit: Admitting: Family
